# Patient Record
Sex: Female | Born: 1965 | Race: White | Hispanic: No | Marital: Married | Smoking: Never smoker
Health system: Southern US, Community
[De-identification: ages and names within clinical notes are randomized; demographics above are authoritative.]

## PROBLEM LIST (undated history)

## (undated) DIAGNOSIS — R112 Nausea with vomiting, unspecified: Secondary | ICD-10-CM

## (undated) DIAGNOSIS — E039 Hypothyroidism, unspecified: Secondary | ICD-10-CM

## (undated) DIAGNOSIS — Z9889 Other specified postprocedural states: Secondary | ICD-10-CM

## (undated) HISTORY — PX: ABDOMINAL HYSTERECTOMY: SHX81

## (undated) HISTORY — DX: Hypothyroidism, unspecified: E03.9

## (undated) HISTORY — PX: AUGMENTATION MAMMAPLASTY: SUR837

---

## 1998-10-05 ENCOUNTER — Encounter: Payer: Self-pay | Admitting: Obstetrics & Gynecology

## 1998-10-05 ENCOUNTER — Ambulatory Visit (HOSPITAL_COMMUNITY): Admission: RE | Admit: 1998-10-05 | Discharge: 1998-10-05 | Payer: Self-pay | Admitting: Obstetrics & Gynecology

## 1999-10-29 ENCOUNTER — Other Ambulatory Visit: Admission: RE | Admit: 1999-10-29 | Discharge: 1999-10-29 | Payer: Self-pay | Admitting: Obstetrics & Gynecology

## 2001-07-31 ENCOUNTER — Encounter: Payer: Self-pay | Admitting: Obstetrics & Gynecology

## 2001-07-31 ENCOUNTER — Encounter: Admission: RE | Admit: 2001-07-31 | Discharge: 2001-07-31 | Payer: Self-pay | Admitting: Obstetrics & Gynecology

## 2001-11-25 ENCOUNTER — Other Ambulatory Visit: Admission: RE | Admit: 2001-11-25 | Discharge: 2001-11-25 | Payer: Self-pay | Admitting: Obstetrics & Gynecology

## 2002-03-05 ENCOUNTER — Ambulatory Visit (HOSPITAL_COMMUNITY): Admission: RE | Admit: 2002-03-05 | Discharge: 2002-03-05 | Payer: Self-pay | Admitting: Obstetrics & Gynecology

## 2002-03-05 ENCOUNTER — Encounter (INDEPENDENT_AMBULATORY_CARE_PROVIDER_SITE_OTHER): Payer: Self-pay | Admitting: Specialist

## 2002-12-24 ENCOUNTER — Other Ambulatory Visit: Admission: RE | Admit: 2002-12-24 | Discharge: 2002-12-24 | Payer: Self-pay | Admitting: Obstetrics & Gynecology

## 2004-01-17 ENCOUNTER — Other Ambulatory Visit: Admission: RE | Admit: 2004-01-17 | Discharge: 2004-01-17 | Payer: Self-pay | Admitting: Obstetrics & Gynecology

## 2005-05-08 ENCOUNTER — Other Ambulatory Visit: Admission: RE | Admit: 2005-05-08 | Discharge: 2005-05-08 | Payer: Self-pay | Admitting: Obstetrics & Gynecology

## 2005-12-16 HISTORY — PX: ABDOMINAL HYSTERECTOMY: SHX81

## 2006-02-12 ENCOUNTER — Observation Stay (HOSPITAL_COMMUNITY): Admission: RE | Admit: 2006-02-12 | Discharge: 2006-02-13 | Payer: Self-pay | Admitting: Obstetrics & Gynecology

## 2006-02-12 ENCOUNTER — Encounter (INDEPENDENT_AMBULATORY_CARE_PROVIDER_SITE_OTHER): Payer: Self-pay | Admitting: Specialist

## 2007-06-15 ENCOUNTER — Encounter: Admission: RE | Admit: 2007-06-15 | Discharge: 2007-06-15 | Payer: Self-pay | Admitting: Obstetrics & Gynecology

## 2009-04-26 ENCOUNTER — Ambulatory Visit (HOSPITAL_COMMUNITY): Admission: RE | Admit: 2009-04-26 | Discharge: 2009-04-26 | Payer: Self-pay | Admitting: Podiatry

## 2010-06-14 ENCOUNTER — Encounter: Admission: RE | Admit: 2010-06-14 | Discharge: 2010-06-14 | Payer: Self-pay | Admitting: Obstetrics & Gynecology

## 2011-01-06 ENCOUNTER — Encounter: Payer: Self-pay | Admitting: Obstetrics & Gynecology

## 2011-05-03 NOTE — Op Note (Signed)
NAMEJOELYNN, Destiny Leon                ACCOUNT NO.:  [id number]   MEDICAL RECORD NO.:  [id number]          PATIENT TYPE:  OBV   LOCATION:  9399                          FACILITY:  [facility]   PHYSICIAN:  Ilda Mori, M.D.   DATE OF BIRTH:  [birth year]   DATE OF PROCEDURE:  02/12/2006  DATE OF DISCHARGE:                                 OPERATIVE REPORT   PREOPERATIVE DIAGNOSIS:  Menorrhagia, dysmenorrhea.   POSTOPERATIVE DIAGNOSIS:  Menorrhagia, dysmenorrhea.   PROCEDURE:  Laparoscopically-assisted vaginal hysterectomy.   SURGEON:  Dr. Ilda Mori   ASSISTANT:  Dr. Lodema Hong.   ANESTHESIA:  Was general endotracheal   ESTIMATED BLOOD LOSS:  100 mL.   FINDINGS:  Filmy bilateral adnexal adhesions. No evidence of endometriosis.  Small anterior uterine myoma.   COMPLICATIONS:  Were none.   SPECIMENS TO PATHOLOGY:  The uterus.   INDICATIONS:  This is a 45 year old nulligravida female who has a long  history of heavy painful periods. The patient was treated with cryoablation  in 2003 and had approximately 1-2 years of relief however, the dysmenorrhea  and menorrhagia recurred and the patient elects to proceed with a vaginal  hysterectomy. Because of her history of infertility and pain decision was  made to proceed with diagnostic laparoscopy prior to proceeding with vaginal  hysterectomy to ensure that there was no adnexal pathology present.   PROCEDURE:  The patient was taken to the operating placed in supine position  and general endotracheal anesthesia was induced. She was then placed in  modified dorsal lithotomy position and the abdomen, perineum and vagina were  prepped and draped in sterile fashion. The bladder was catheterized and a  Hulka tenaculum placed in the endocervical canal and affixed to the anterior  cervical lip. The surgeon the regowned and gloved. A small incision was made  in the base of the umbilicus. The Veress needle was introduced into  peritoneal cavity  and pneumoperitoneum was created. The 5 mm trocar was then  placed and a 5 mm scope was used to view the pelvis. An accessory stab was  placed in the suprapubic area and another 5 mm port was placed and the  pelvis was viewed and several adhesions were noted. The ovaries were  occluded by these adhesions and scarred down to the pelvic sidewalls. These  adhesions were filmy and decision was made to take them down. An accessory  instrument was placed through another stab wound in the left lower quadrant  and another instrument was placed. Using the laparoscopic graspers for  traction the laparoscopic scissors were used to cut down the filmy adhesions  without any bleeding. At the end of this procedure both ovaries were freed  from their adhesions and the tubes were mobile. Small clubbing was noted on  either side but no hydrosalpingies were seen. The ovarian uterine  anastomosis and the round ligaments were then cauterized with a tripolar  cautery current and cut to free up the fundus of the uterus down to and  slightly past the round ligaments. The procedure was then continued  vaginally. The patient was  placed in a normal dorsal lithotomy position and  the weighted speculum was placed in the vagina. The cervix was grasped with  a heavy cervical tenaculum and the paracervical tissues were infiltrated  with a dilute Marcaine solution with 1:200,000 epinephrine. The cervix was  then circumcised and the vaginal mucosa was dissected free. The anterior cul-  de-sac was then developed but the peritoneum was not entered. The posterior  cul-de-sac was developed and the peritoneum was entered posteriorly. The  uterosacral ligaments were grasped bilaterally with Heaney clamps, cut and  ligated with a transfixion suture and held. The remainder of the cardinal  ligaments were then grasped with a gyrus vessel sealing forceps, cauterized  and cut. At this point the anterior cul-de-sac was entered easily  and the  remainder of the cardinal ligaments and the uterine arteries were grasped,  cauterized with the sealing forceps and cut. The dissection was then  continued and cauterization was continued until the abdominal cauterization  areas were met and the uterus was then removed. The posterior cul-de-sac was  then closed with a running interlocking Vicryl one suture. A cul-de-sac  support stitch was placed in the posterior cul-de-sac from uterosacral to  uterosacral. The peritoneum was then closed with a pursestring suture. These  sutures were then tied and the uterosacral pedicle was then also tied across  the midline. The vaginal cuff was closed with figure-of-eight sutures. The  bladder was catheterized. Clear urine was obtained. The surgeon then  regloved and the pneumoperitoneum was recreated. The laparoscope was used to  visualize the surgical field and all the pedicles appeared to be dry.  Procedure was then terminated and the abdominal wounds were closed with  Dermabond adhesive. The procedure was then terminated. The patient left the  operating room in good condition.      Ilda Mori, M.D.  Electronically Signed     RK/MEDQ  D:  02/12/2006  T:  02/13/2006  Job:  [id number]

## 2011-05-03 NOTE — H&P (Signed)
NAME:  Destiny Leon, Destiny Leon                ACCOUNT NO.:  [id number]   MEDICAL RECORD NO.:  [id number]          PATIENT TYPE:  AMB   LOCATION:  SDC                           FACILITY:  [facility]   PHYSICIAN:  Ilda Mori, M.D.   DATE OF BIRTH:  [birth year]   DATE OF ADMISSION:  02/12/2006  DATE OF DISCHARGE:                                HISTORY & PHYSICAL   CHIEF COMPLAINT:  Menorrhagia and dysmenorrhea.   HISTORY OF PRESENT ILLNESS:  This is a 45 year old nulligravid female who  has been followed in the office for several years with heavy menstrual  periods and moderate to severe dysmenorrhea.  The patient understand a  cryoablation in 2003 with good results.  However, approximately a year and a  half after the procedure, the patient once again began noting increasingly  heavy and painful periods.  At that time, options including repeat  endometrial ablation, Mirena IUD or continuous birth control pills were  discussed with the patient.  The patient tried continuous birth control  pills, but felt that these really did not alleviate her symptoms.  The  decision was therefore reached to proceed with hysterectomy for control of  her abnormal bleeding and painful bleeding.   PAST MEDICAL HISTORY:  She has a history of infertility.  In addition, she  had a cryoablation in 2003.  She has no medical problems.   MEDICATIONS:  She is on no medications at the present time.   ALLERGIES:  No known drug allergies.   SOCIAL HISTORY:  She exercises regularly.  She does not smoke.  She has  wanted to adopt a child, and works as a Engineer, agricultural.   FAMILY HISTORY:  Positive for high cholesterol in her mother and brother.  Osteoporosis in her mother and grandmother.  Negative for breast, ovarian,  uterine or colon cancer, and negative for heart disease and hypertension.   REVIEW OF SYSTEMS:  Negative in detail.   PHYSICAL EXAMINATION:  VITAL SIGNS:  She is 5 feet, 4 inches tall.  She  weighs 115  pounds.  Afebrile.  Regular sinus rhythm.  Blood pressure 100/50.  HEENT:  Ears, nose and throat are normal.  NECK:  Supple without thyromegaly.  HEART:  Without murmurs or gallops.  LUNGS:  Clear.  ABDOMEN:  Soft without hepatosplenomegaly.  BREASTS:  Without masses.  She does have bilaterally implants.  PELVIC EXAM:  Normal external genitalia.  The cervix appears normal.  Uterus  is well supported, normal size, shape and contour.  Her adnexa are without  masses.   The decision was made to proceed with a vaginal hysterectomy.  Because of  her history of infertility and her significant painful periods, a  laparoscopic evaluation was performed at the time of hysterectomy to rule  out a significant endometriosis or pelvic adhesive disease.  At that point,  the adnexa will be removed only if pathology is  found.  A vaginal hysterectomy will be attempted, which, based upon the  concerns over the fact that she is nulliparous, will hopefully go smoothly,  but the patient understands that an  abdominal approach may be necessary if  the appropriate descensus cannot be obtained.      Ilda Mori, M.D.  Electronically Signed     RK/MEDQ  D:  02/11/2006  T:  02/11/2006  Job:  [id number]

## 2011-05-03 NOTE — Op Note (Signed)
[facility]  PatientSHARAE, Destiny Leon Visit Number: [id number] MRN: [id number]          Service Type: DSU Location: [facility] Attending Physician:  Lars Pinks Dictated by:   Caralyn Guile. [name], M.D. Proc. Date: 03/05/02 Admit Date:  03/05/2002 Discharge Date: 03/05/2002                             Operative Report  PREOPERATIVE DIAGNOSIS:       Menorrhagia.  POSTOPERATIVE DIAGNOSIS:      Menorrhagia.  OPERATION:                    Hysteroscopy, D&C, and cryoablation.  SURGEON:                      Richard D. [name], M.D.  ANESTHESIA:                   IV sedation with paracervical block.  ESTIMATED BLOOD LOSS:         10 cc.  FINDINGS:                     Endometrial cavity appeared normal.  There were no polyps or myoma, and sounded to 7 cm.  INDICATIONS:                  This is a 45 year old nulligravid female who has had periods lasting 10-14 days with premenstrual spotting of six to seven days.  The problem has lasted for more than year and has not responded to nonsteroidal anti-inflammatory drugs or hormonal therapy.  Options were discussed with the patient and it was elected to perform a cryoablation in the hopes of creating amenorrhea or limiting the number of flow days and making her periods lighter.  DESCRIPTION OF PROCEDURE:     The patient was taken to the operating room and placed in the dorsolithotomy position.  IV sedation was administered.  The vulva was prepped.  Evaluation under anesthesia revealed a normal size retroverted uterus.  The anterior lip of the cervix was grasped with a single tooth tenaculum.  Twenty cubic centimeters of 1% lidocaine was placed in the paracervical tissues.  The uterus was sounded to 7 cm.  The uterus was dilated with Shawnie Pons dilators to a 23-French.  A hysteroscope was introduced and the endometrial cavity was viewed.  The tissue appeared to be somewhat thickened and the decision was  made to proceed with a D&C to thin the lining in hopes of improving the ablation.  D&C was then carried out.  The hysteroscope was reintroduced and a fair amount of shaggy endometrium was noted.  A suction curettage was then performed with a #7 curet to further thin the endometrium.  Following this, a cryoprobe was introduced into the right cornual area, and the endometrium was frozen for five minutes.  The cryoprobe was then reintroduced into the left cornual area and a six minute freeze was performed. The cryoprobe was then repositioned in the left cornual area and an additional four minute freeze was performed.  The procedure was then terminated.  The patient left the operating room in good condition. Dictated by:   Caralyn Guile [name], M.D. Attending Physician:  Lars Pinks DD:  03/05/02 TD:  03/08/02 Job: [id number]

## 2016-04-12 ENCOUNTER — Encounter: Payer: Self-pay | Admitting: Primary Care

## 2016-04-12 ENCOUNTER — Ambulatory Visit (INDEPENDENT_AMBULATORY_CARE_PROVIDER_SITE_OTHER): Payer: BLUE CROSS/BLUE SHIELD | Admitting: Primary Care

## 2016-04-12 VITALS — BP 104/64 | HR 71 | Temp 98.5°F | Ht <= 58 in | Wt 121.4 lb

## 2016-04-12 DIAGNOSIS — B370 Candidal stomatitis: Secondary | ICD-10-CM | POA: Insufficient documentation

## 2016-04-12 DIAGNOSIS — E039 Hypothyroidism, unspecified: Secondary | ICD-10-CM

## 2016-04-12 LAB — TSH: TSH: 2.28 u[IU]/mL (ref 0.35–4.50)

## 2016-04-12 MED ORDER — NYSTATIN 100000 UNIT/ML MT SUSP: OROMUCOSAL | Status: DC

## 2016-04-12 NOTE — Patient Instructions (Addendum)
Complete lab work prior to leaving today. I will notify you of your results once received.   I will send refills of your levothyroxine 50 mcg through mail order.  Swish and swallow 5 ml of the Nystatin oral solution four times daily. Try to retain the fluid for at least 1-2 minutes prior to swallowing.   Please schedule a physical with me this summer/fall at your convenience. You may also schedule a lab only appointment 3-4 days prior. We will discuss your lab results in detail during your physical.  It was a pleasure to meet you today! Please don't hesitate to call me with any questions. Welcome to [facility]!

## 2016-04-12 NOTE — Assessment & Plan Note (Signed)
Present to tongue for 6 months. Good oral hygiene.  Suspect yeast buildup and will treat with Nystatin oral solution. She is to update me if no improvement.

## 2016-04-12 NOTE — Progress Notes (Signed)
   Subjective:    Patient ID: Destiny Leon, female    DOB: 01/25/1966, 50 y.o.   MRN: [id number]  HPI  Ms. Destiny Leon is a 50 year old female who presents today to establish care and discuss the problems mentioned below. Will obtain old records. Her last physical was May 2016.   1) Hypothyroidism: Diagnosed 5-6 years ago. Currently managed on Levothyroxine 50 mcg. Her last TSH was about 1 year ago and was normal. She is needing a refill of her medication.   2) White Film: Located to her tongue. She has noticed a white film on her tongue consistently for the past 6 months. Her dentist provided her with a plastic scraper that she uses with some/temporary improvement. Denies pain, itching, changes in taste. She's had no other treatment.   Review of Systems  Constitutional: Negative for unexpected weight change.  HENT: Negative for rhinorrhea.        White substance on tongue  Respiratory: Negative for cough and shortness of breath.   Cardiovascular: Negative for chest pain and palpitations.  Endocrine: Negative for cold intolerance.  Neurological: Negative for dizziness and headaches.       Past Medical History  Diagnosis Date  . Hypothyroidism      Social History   Social History  . Marital Status: Married    Spouse Name: N/A  . Number of Children: N/A  . Years of Education: N/A   Occupational History  . Not on file.   Social History Main Topics  . Smoking status: Never Smoker   . Smokeless tobacco: Not on file  . Alcohol Use: No  . Drug Use: No  . Sexual Activity: Not on file   Other Topics Concern  . Not on file   Social History Narrative   Married.   1 child   Works in [employer]   Enjoys gardening, spending time with her son.        Past Surgical History  Procedure Laterality Date  . Abdominal hysterectomy  2007    Family History  Problem Relation Age of Onset  . Heart disease Father   . Hyperlipidemia Mother   . Hypothyroidism Mother     No Known  Allergies  No current outpatient prescriptions on file prior to visit.   No current facility-administered medications on file prior to visit.    BP 104/64 mmHg  Pulse 71  Temp(Src) 98.5 F (36.9 C) (Oral)  Ht 4' 5.25" (1.353 m)  Wt 121 lb 6.4 oz (55.067 kg)  BMI 30.08 kg/m2  SpO2 98%    Objective:   Physical Exam  Constitutional: She appears well-nourished.  HENT:  Mild amount of whitish substance coating tongue.  Neck: Neck supple.  Cardiovascular: Normal rate and regular rhythm.   Pulmonary/Chest: Effort normal and breath sounds normal.  Skin: Skin is warm and dry.  Psychiatric: She has a normal mood and affect.          Assessment & Plan:

## 2016-04-12 NOTE — Assessment & Plan Note (Signed)
History of for 5-6 years, currently managed on levothyroxine 50 mcg. TSH pending today, will send refills to mail order if stable.

## 2016-04-14 ENCOUNTER — Other Ambulatory Visit: Payer: Self-pay | Admitting: Primary Care

## 2016-04-14 DIAGNOSIS — E039 Hypothyroidism, unspecified: Secondary | ICD-10-CM

## 2016-04-14 MED ORDER — LEVOTHYROXINE SODIUM 50 MCG PO TABS: 50.0000 ug | ORAL_TABLET | Freq: Every day | ORAL | Status: DC

## 2016-06-19 ENCOUNTER — Encounter: Payer: Self-pay | Admitting: Family Medicine

## 2016-06-19 ENCOUNTER — Ambulatory Visit (INDEPENDENT_AMBULATORY_CARE_PROVIDER_SITE_OTHER): Payer: BLUE CROSS/BLUE SHIELD | Admitting: Family Medicine

## 2016-06-19 VITALS — BP 114/76 | HR 72 | Temp 98.1°F | Wt 122.2 lb

## 2016-06-19 DIAGNOSIS — M7989 Other specified soft tissue disorders: Secondary | ICD-10-CM | POA: Diagnosis not present

## 2016-06-19 DIAGNOSIS — IMO0001 Reserved for inherently not codable concepts without codable children: Secondary | ICD-10-CM

## 2016-06-19 LAB — CBC WITH DIFFERENTIAL/PLATELET
BASOS PCT: 0.6 % (ref 0.0–3.0)
Basophils Absolute: 0 10*3/uL (ref 0.0–0.1)
EOS ABS: 0.3 10*3/uL (ref 0.0–0.7)
EOS PCT: 3.3 % (ref 0.0–5.0)
HEMATOCRIT: 38 % (ref 36.0–46.0)
HEMOGLOBIN: 12.8 g/dL (ref 12.0–15.0)
LYMPHS PCT: 26.9 % (ref 12.0–46.0)
Lymphs Abs: 2 10*3/uL (ref 0.7–4.0)
MCHC: 33.7 g/dL (ref 30.0–36.0)
MCV: 88.3 fl (ref 78.0–100.0)
Monocytes Absolute: 0.6 10*3/uL (ref 0.1–1.0)
Monocytes Relative: 7.7 % (ref 3.0–12.0)
Neutro Abs: 4.7 10*3/uL (ref 1.4–7.7)
Neutrophils Relative %: 61.5 % (ref 43.0–77.0)
Platelets: 263 10*3/uL (ref 150.0–400.0)
RBC: 4.31 Mil/uL (ref 3.87–5.11)
RDW: 12.9 % (ref 11.5–15.5)
WBC: 7.6 10*3/uL (ref 4.0–10.5)

## 2016-06-19 LAB — HIGH SENSITIVITY CRP: CRP, High Sensitivity: 0.73 mg/L (ref 0.000–5.000)

## 2016-06-19 LAB — RHEUMATOID FACTOR: Rhuematoid fact SerPl-aCnc: <10 IU/mL (ref <=14)

## 2016-06-19 LAB — URIC ACID: Uric Acid, Serum: 4.7 mg/dL (ref 2.4–7.0)

## 2016-06-19 LAB — SEDIMENTATION RATE: SED RATE: 13 mm/h (ref 0–30)

## 2016-06-19 MED ORDER — PREDNISONE 20 MG PO TABS: ORAL_TABLET | ORAL | Status: DC

## 2016-06-19 NOTE — Progress Notes (Signed)
Pre visit review using our clinic review tool, if applicable. No additional management support is needed unless otherwise documented below in the visit note. 

## 2016-06-19 NOTE — Patient Instructions (Addendum)
Labs today. Start prednisone course - sent to pharmacy.  If worsening instead of improving let us know for hand doctor referral.

## 2016-06-19 NOTE — Progress Notes (Signed)
BP 114/76 mmHg  Pulse 72  Temp(Src) 98.1 F (36.7 C) (Oral)  Wt 122 lb 4 oz (55.452 kg)   CC: L hand swelling  Subjective:    Patient ID: Destiny Leon, female    DOB: 1966/09/04, 50 y.o.   MRN: [id number]  HPI: Destiny Leon is a 50 y.o. female presenting on 06/19/2016 for Hand Pain   3d h/o L hand swelling. Started Monday afternoon with swelling and pain of index finger. Yesterday still able to type on computer. Today awoke with pain/swelling of entire hand with radiation of pain down elbow - ache/throbbing pain. Unable to straighten fingers due to pain. Feeling tired/run down.   No fevers/chills, nausea, new rashes. No redness/warmth. No other joint problems in the past.   Tried benadryl and tylenol today as well as essential oil.  Worked in yard Saturday without known injury. No known bug bite either. She did have mild exposure to poison oak, nothing new.   Relevant past medical, surgical, family and social history reviewed and updated as indicated. Interim medical history since our last visit reviewed. Allergies and medications reviewed and updated. Current Outpatient Prescriptions on File Prior to Visit  Medication Sig  . levothyroxine (SYNTHROID, LEVOTHROID) 50 MCG tablet Take 1 tablet (50 mcg total) by mouth daily before breakfast.   No current facility-administered medications on file prior to visit.    Review of Systems Per HPI unless specifically indicated in ROS section     Objective:    BP 114/76 mmHg  Pulse 72  Temp(Src) 98.1 F (36.7 C) (Oral)  Wt 122 lb 4 oz (55.452 kg)  Wt Readings from Last 3 Encounters:  06/19/16 122 lb 4 oz (55.452 kg)  04/12/16 121 lb 6.4 oz (55.067 kg)    Physical Exam  Constitutional: She appears well-developed and well-nourished. No distress.  Musculoskeletal: She exhibits edema.  R hand WNL L hand with evident swelling and pain to palpation at 2nd MCP and PIP, diminished ROM throughout 2nd finger. Mild discomfort and  swelling 3rd finger MCP/PIP. 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 No rash noted. No erythema.  Psychiatric: She has a normal mood and affect.  Nursing note and vitals reviewed.  Results for orders placed or performed in visit on 04/12/16  TSH  Result Value Ref Range   TSH 2.28 0.35 - 4.50 uIU/mL      Assessment & Plan:   Problem List Items Addressed This Visit    Swelling of second finger of left hand - Primary    Marked swelling with pain of L 2nd index finger at MCP, PIP, mild dactylitis that started after working in the yard. Unclear etiology.  Focal acute inflammatory arthritis of 2nd MCP/PIP vs ?tendonitis.   Check labs today including CBC, urate, ESR, CRP, RF and anti-CCP.  Treat with prednisone course. Doubt septic joint as no erythema or warmth noted today - antibiotic not started.  If worsening over next 24 hours, advised to update [place] for urgent referral to hand surgery for further evaluation. Pt agrees with plan.       Relevant Orders   CBC with Differential/Platelet   Uric acid   Sedimentation rate   High sensitivity CRP   Rheumatoid factor   Cyclic citrul peptide antibody, IgG       Follow up plan: Return if symptoms worsen  or fail to improve.  Ria Bush, MD

## 2016-06-19 NOTE — Assessment & Plan Note (Addendum)
Marked swelling with pain of L 2nd index finger at MCP, PIP, mild dactylitis that started after working in the yard. Unclear etiology.  Focal acute inflammatory arthritis of 2nd MCP/PIP vs ?tendonitis.   Check labs today including CBC, urate, ESR, CRP, RF and anti-CCP.  Treat with prednisone course. Doubt septic joint as no erythema or warmth noted today - antibiotic not started.  If worsening over next 24 hours, advised to update [place] for urgent referral to hand surgery for further evaluation. Pt agrees with plan.

## 2016-06-20 LAB — CYCLIC CITRUL PEPTIDE ANTIBODY, IGG

## 2017-03-09 ENCOUNTER — Other Ambulatory Visit: Payer: Self-pay | Admitting: Primary Care

## 2017-03-09 DIAGNOSIS — E039 Hypothyroidism, unspecified: Secondary | ICD-10-CM

## 2017-03-10 NOTE — Telephone Encounter (Signed)
Lm on pts vm requesting a call back to schedule OV 

## 2017-03-10 NOTE — Telephone Encounter (Signed)
Ok to refill? Electronically refill request for levothyroxine (SYNTHROID, LEVOTHROID) 50 MCG tablet. Last prescribed on 04/14/2016. Last seen on 06/19/2016.

## 2017-03-12 NOTE — Telephone Encounter (Signed)
Spoken to patient. CPE on 04/23/2017

## 2017-04-23 ENCOUNTER — Encounter: Payer: Self-pay | Admitting: Primary Care

## 2017-04-23 ENCOUNTER — Ambulatory Visit (INDEPENDENT_AMBULATORY_CARE_PROVIDER_SITE_OTHER): Payer: BLUE CROSS/BLUE SHIELD | Admitting: Primary Care

## 2017-04-23 VITALS — BP 118/82 | HR 62 | Temp 98.0°F | Ht 64.5 in | Wt 122.2 lb

## 2017-04-23 DIAGNOSIS — Z0000 Encounter for general adult medical examination without abnormal findings: Secondary | ICD-10-CM | POA: Insufficient documentation

## 2017-04-23 DIAGNOSIS — Z23 Encounter for immunization: Secondary | ICD-10-CM

## 2017-04-23 DIAGNOSIS — R42 Dizziness and giddiness: Secondary | ICD-10-CM | POA: Diagnosis not present

## 2017-04-23 DIAGNOSIS — Z0001 Encounter for general adult medical examination with abnormal findings: Secondary | ICD-10-CM | POA: Insufficient documentation

## 2017-04-23 DIAGNOSIS — E039 Hypothyroidism, unspecified: Secondary | ICD-10-CM

## 2017-04-23 MED ORDER — ONDANSETRON 4 MG PO TBDP: 4.0000 mg | ORAL_TABLET | Freq: Three times a day (TID) | ORAL | 0 refills | Status: DC | PRN

## 2017-04-23 NOTE — Progress Notes (Signed)
Pre visit review using our clinic review tool, if applicable. No additional management support is needed unless otherwise documented below in the visit note. 

## 2017-04-23 NOTE — Assessment & Plan Note (Signed)
Td due, provided today. Mammogram due, she declines. Colonoscopy due, she opts for Cologuard and will check with insurance for coverage. Discussed to start exercising. Exam unremarkable. Labs pending. Follow up in 1 year.

## 2017-04-23 NOTE — Assessment & Plan Note (Signed)
TSH pending, will make adjustments to levothyroxine if necessary. Continue 50 mcg for now.

## 2017-04-23 NOTE — Progress Notes (Signed)
Subjective:    Patient ID: Destiny Leon, female    DOB: 12/03/1966, 51 y.o.   MRN: [id number]  HPI  Destiny Leon is a 51 year old female who presents today for complete physical.  Immunizations: -Tetanus: Due. -Influenza: Did not complete last season.   Diet: She endorses a healthy diet. Breakfast: Protein bars Lunch: Frozen meal, left overs Dinner: Fish, chicken, pork, vegetable Snacks: None Desserts: Ice cream, three nights weekly. Beverages: Coffee, carnation instant breakfast, water, decaf sweet tea  Exercise: She does not regularly exercise, is active.  Eye exam: Completed in December 2017. Dental exam: Completes semi-annually. Colonoscopy: Declines colonoscopy, opts for Cologuard. Pap Smear: Hysterectomy Mammogram: Completed 2 years ago. Declines.   Review of Systems  Constitutional: Negative for unexpected weight change.  HENT: Negative for rhinorrhea.   Respiratory: Negative for cough and shortness of breath.   Cardiovascular: Negative for chest pain.  Gastrointestinal: Negative for constipation and diarrhea.  Genitourinary: Negative for difficulty urinating and menstrual problem.  Musculoskeletal: Positive for arthralgias. Negative for myalgias.  Skin: Negative for rash.  Allergic/Immunologic: Negative for environmental allergies.  Neurological: Negative for numbness and headaches.       Intermittent vertigo  Psychiatric/Behavioral:       She denies concerns for anxiety or depression       Past Medical History:  Diagnosis Date  . Hypothyroidism      Social History   Social History  . Marital status: Married    Spouse name: N/A  . Number of children: N/A  . Years of education: N/A   Occupational History  . Not on file.   Social History Main Topics  . Smoking status: Never Smoker  . Smokeless tobacco: Never Used  . Alcohol use No  . Drug use: No  . Sexual activity: Not on file   Other Topics Concern  . Not on file   Social History  Narrative   Married.   1 child   Works in [employer]   Enjoys gardening, spending time with her son.        Past Surgical History:  Procedure Laterality Date  . ABDOMINAL HYSTERECTOMY  2007    Family History  Problem Relation Age of Onset  . Heart disease Father   . Hyperlipidemia Mother   . Hypothyroidism Mother     No Known Allergies  Current Outpatient Prescriptions on File Prior to Visit  Medication Sig Dispense Refill  . levothyroxine (SYNTHROID, LEVOTHROID) 50 MCG tablet TAKE 1 TABLET DAILY BEFORE BREAKFAST 90 tablet 0   No current facility-administered medications on file prior to visit.     BP 118/82   Pulse 62   Temp 98 F (36.7 C) (Oral)   Ht 5' 4.5" (1.638 m)   Wt 122 lb 4 oz (55.5 kg)   BMI 20.66 kg/m    Objective:   Physical Exam  Constitutional: She is oriented to person, place, and time. She appears well-nourished.  HENT:  Right Ear: Tympanic membrane and ear canal normal.  Left Ear: Tympanic membrane and ear canal normal.  Nose: Nose normal.  Mouth/Throat: Oropharynx is clear and moist.  Eyes: Conjunctivae and EOM are normal. Pupils are equal, round, and reactive to light.  Neck: Neck supple. No thyromegaly present.  Cardiovascular: Normal rate and regular rhythm.   No murmur heard. Pulmonary/Chest: Effort normal and breath sounds normal. She has no rales.  Abdominal: Soft. Bowel sounds are normal. There is no tenderness.  Musculoskeletal: Normal range of motion.  Lymphadenopathy:    She has no cervical adenopathy.  Neurological: She is alert and oriented to person, place, and time. She has normal reflexes. No cranial nerve deficit.  Skin: Skin is warm and dry. No rash noted.  Psychiatric: She has a normal mood and affect.          Assessment & Plan:

## 2017-04-23 NOTE — Assessment & Plan Note (Signed)
Intermittent bouts, infrequent. Rx for Zofran provided to use PRN. Discussed use of Meclizine and Epley maneuvers.

## 2017-04-23 NOTE — Patient Instructions (Addendum)
Schedule a lab only appointment to return fasting for your labs. You may have water and black coffee. I'll be in touch once I receive your results.  Start exercising. You should be getting 150 minutes of moderate intensity exercise weekly.  Continue your efforts towards a healthy diet.  I sent a prescription for ondansetron (Zofran) tablets to your pharmacy. Melt one tablet under your tongue every 8 hours as needed for nausea related to vertigo.  You may try Meclizine tablets as needed for vertigo. Caution as this may cause drowsiness.  You were provided with a tetanus vaccination which will cover you for 10 years.  Follow up in 1 year for your annual exam or sooner if needed.  It was a pleasure to see you today!  How to Perform the Epley Maneuver The Epley maneuver is an exercise that relieves symptoms of vertigo. Vertigo is the feeling that you or your surroundings are moving when they are not. When you feel vertigo, you may feel like the room is spinning and have trouble walking. Dizziness is a little different than vertigo. When you are dizzy, you may feel unsteady or light-headed. You can do this maneuver at home whenever you have symptoms of vertigo. You can do it up to 3 times a day until your symptoms go away. Even though the Epley maneuver may relieve your vertigo for a few weeks, it is possible that your symptoms will return. This maneuver relieves vertigo, but it does not relieve dizziness. What are the risks? If it is done correctly, the Epley maneuver is considered safe. Sometimes it can lead to dizziness or nausea that goes away after a short time. If you develop other symptoms, such as changes in vision, weakness, or numbness, stop doing the maneuver and call your health care provider. How to perform the Epley maneuver 1. Sit on the edge of a bed or table with your back straight and your legs extended or hanging over the edge of the bed or table. 2. Turn your head halfway  toward the affected ear or side. 3. Lie backward quickly with your head turned until you are lying flat on your back. You may want to position a pillow under your shoulders. 4. Hold this position for 30 seconds. You may experience an attack of vertigo. This is normal. 5. Turn your head to the opposite direction until your unaffected ear is facing the floor. 6. Hold this position for 30 seconds. You may experience an attack of vertigo. This is normal. Hold this position until the vertigo stops. 7. Turn your whole body to the same side as your head. Hold for another 30 seconds. 8. Sit back up. You can repeat this exercise up to 3 times a day. Follow these instructions at home:  After doing the Epley maneuver, you can return to your normal activities.  Ask your health care provider if there is anything you should do at home to prevent vertigo. He or she may recommend that you:  Keep your head raised (elevated) with two or more pillows while you sleep.  Do not sleep on the side of your affected ear.  Get up slowly from bed.  Avoid sudden movements during the day.  Avoid extreme head movement, like looking up or bending over. Contact a health care provider if:  Your vertigo gets worse.  You have other symptoms, including:  Nausea.  Vomiting.  Headache. Get help right away if:  You have vision changes.  You have a severe  or worsening headache or neck pain.  You cannot stop vomiting.  You have new numbness or weakness in any part of your body. Summary  Vertigo is the feeling that you or your surroundings are moving when they are not.  The Epley maneuver is an exercise that relieves symptoms of vertigo.  If the Epley maneuver is done correctly, it is considered safe. You can do it up to 3 times a day. This information is not intended to replace advice given to you by your health care provider. Make sure you discuss any questions you have with your health care  provider. Document Released: 12/07/2013 Document Revised: 10/22/2016 Document Reviewed: 10/22/2016 Elsevier Interactive Patient Education  2017 [employer].

## 2017-04-24 ENCOUNTER — Encounter: Payer: Self-pay | Admitting: *Deleted

## 2017-04-24 ENCOUNTER — Other Ambulatory Visit (INDEPENDENT_AMBULATORY_CARE_PROVIDER_SITE_OTHER): Payer: BLUE CROSS/BLUE SHIELD

## 2017-04-24 DIAGNOSIS — E039 Hypothyroidism, unspecified: Secondary | ICD-10-CM | POA: Diagnosis not present

## 2017-04-24 DIAGNOSIS — Z0000 Encounter for general adult medical examination without abnormal findings: Secondary | ICD-10-CM | POA: Diagnosis not present

## 2017-04-24 LAB — COMPREHENSIVE METABOLIC PANEL
ALBUMIN: 4.2 g/dL (ref 3.5–5.2)
ALT: 9 U/L (ref 0–35)
AST: 15 U/L (ref 0–37)
Alkaline Phosphatase: 62 U/L (ref 39–117)
BILIRUBIN TOTAL: 0.6 mg/dL (ref 0.2–1.2)
BUN: 17 mg/dL (ref 6–23)
CALCIUM: 9.2 mg/dL (ref 8.4–10.5)
CO2: 29 mEq/L (ref 19–32)
CREATININE: 1.01 mg/dL (ref 0.40–1.20)
Chloride: 107 mEq/L (ref 96–112)
GFR: 61.21 mL/min (ref >60.00)
Glucose, Bld: 92 mg/dL (ref 70–99)
Potassium: 4.8 mEq/L (ref 3.5–5.1)
SODIUM: 141 meq/L (ref 135–145)
Total Protein: 6.6 g/dL (ref 6.0–8.3)

## 2017-04-24 LAB — LIPID PANEL
Cholesterol: 177 mg/dL (ref 0–200)
HDL: 59.3 mg/dL (ref >39.00)
LDL Cholesterol: 103 mg/dL — ABNORMAL HIGH (ref 0–99)
NonHDL: 118.03
Total CHOL/HDL Ratio: 3
Triglycerides: 74 mg/dL (ref 0.0–149.0)
VLDL: 14.8 mg/dL (ref 0.0–40.0)

## 2017-04-24 LAB — TSH: TSH: 4.06 u[IU]/mL (ref 0.35–4.50)

## 2017-06-07 ENCOUNTER — Other Ambulatory Visit: Payer: Self-pay | Admitting: Primary Care

## 2017-06-07 DIAGNOSIS — E039 Hypothyroidism, unspecified: Secondary | ICD-10-CM

## 2017-12-05 ENCOUNTER — Other Ambulatory Visit: Payer: Self-pay | Admitting: Primary Care

## 2017-12-05 DIAGNOSIS — E039 Hypothyroidism, unspecified: Secondary | ICD-10-CM

## 2018-03-05 ENCOUNTER — Other Ambulatory Visit: Payer: Self-pay | Admitting: Primary Care

## 2018-03-05 DIAGNOSIS — E039 Hypothyroidism, unspecified: Secondary | ICD-10-CM

## 2018-04-14 ENCOUNTER — Ambulatory Visit: Payer: BLUE CROSS/BLUE SHIELD | Admitting: Family Medicine

## 2018-04-15 ENCOUNTER — Ambulatory Visit: Payer: BLUE CROSS/BLUE SHIELD | Admitting: Family Medicine

## 2018-04-15 ENCOUNTER — Encounter: Payer: Self-pay | Admitting: Family Medicine

## 2018-04-15 ENCOUNTER — Other Ambulatory Visit: Payer: Self-pay

## 2018-04-15 VITALS — BP 102/70 | HR 64 | Temp 98.4°F | Ht 64.5 in | Wt 125.2 lb

## 2018-04-15 DIAGNOSIS — R0781 Pleurodynia: Secondary | ICD-10-CM | POA: Diagnosis not present

## 2018-04-15 DIAGNOSIS — S29011A Strain of muscle and tendon of front wall of thorax, initial encounter: Secondary | ICD-10-CM | POA: Diagnosis not present

## 2018-04-15 MED ORDER — GUAIFENESIN-CODEINE 100-10 MG/5ML PO SOLN: 5.0000 mL | Freq: Every evening | ORAL | 0 refills | Status: DC | PRN

## 2018-04-15 NOTE — Progress Notes (Signed)
Dr. Karleen Hampshire T. Elena Davia, MD, CAQ [facility] [address] [place], [address] Phone: [phone] Fax: [phone]  04/15/2018  Patient: Destiny Leon, MRN: [id number], DOB: [birth year], 52 y.o.  Primary Physician:  Doreene Nest, NP   Chief Complaint  Patient presents with  . Cough  . LUQ Pain   Subjective:   Destiny Leon is a 52 y.o. very pleasant female patient who presents with the following:  Coughing really bad, will cough a lot. Had a cold, cough was extreme all day and every day. Noticed her side was going. With coughing it will get worse and not better.  This is all been present on the left lower ribs.  At times she felt like this was getting better, but after she had some worsened coughing, it did return and come back again.  Tried some alleve, motrin, tried antacids, and last night did took some tylenol.  A little bit better.     Past Medical History, Surgical History, Social History, Family History, Problem List, Medications, and Allergies have been reviewed and updated if relevant.  Patient Active Problem List   Diagnosis Date Noted  . Preventative health care 04/23/2017  . Vertigo 04/23/2017  . Hypothyroidism 04/12/2016    Past Medical History:  Diagnosis Date  . Hypothyroidism     Past Surgical History:  Procedure Laterality Date  . ABDOMINAL HYSTERECTOMY  2007    Social History   Socioeconomic History  . Marital status: Married    Spouse name: Not on file  . Number of children: Not on file  . Years of education: Not on file  . Highest education level: Not on file  Occupational History  . Not on file  Social Needs  . Financial resource strain: Not on file  . Food insecurity:    Worry: Not on file    Inability: Not on file  . Transportation needs:    Medical: Not on file    Non-medical: Not on file  Tobacco Use  . Smoking status: Never Smoker  . Smokeless tobacco: Never Used  Substance  and Sexual Activity  . Alcohol use: No    Alcohol/week: 0.0 oz  . Drug use: No  . Sexual activity: Not on file  Lifestyle  . Physical activity:    Days per week: Not on file    Minutes per session: Not on file  . Stress: Not on file  Relationships  . Social connections:    Talks on phone: Not on file    Gets together: Not on file    Attends religious service: Not on file    Active member of club or organization: Not on file    Attends meetings of clubs or organizations: Not on file    Relationship status: Not on file  . Intimate partner violence:    Fear of current or ex partner: Not on file    Emotionally abused: Not on file    Physically abused: Not on file    Forced sexual activity: Not on file  Other Topics Concern  . Not on file  Social History Narrative   Married.   1 child   Works in [employer]   Enjoys gardening, spending time with her son.     Family History  Problem Relation Age of Onset  . Heart disease Father   . Hyperlipidemia Mother   . Hypothyroidism Mother     No Known Allergies  Medication  list reviewed and updated in full in [facility].   GEN: No acute illnesses, no fevers, chills. GI: No n/v/d, eating normally Pulm: No SOB Interactive and getting along well at home.  Otherwise, ROS is as per the HPI.  Objective:   BP 102/70   Pulse 64   Temp 98.4 F (36.9 C) (Oral)   Ht 5' 4.5" (1.638 m)   Wt 125 lb 4 oz (56.8 kg)   BMI 21.17 kg/m   GEN: WDWN, NAD, Non-toxic, A & O x 3 HEENT: Atraumatic, Normocephalic. Neck supple. No masses, No LAD. Ears and Nose: No external deformity. CV: RRR, No M/G/R. No JVD. No thrill. No extra heart sounds. PULM: CTA B, no wheezes, crackles, rhonchi. No retractions. No resp. distress. No accessory muscle use.  Chest wall, neg barrel sign. Bottom sternocostal joint notably tender and intercostal space, not in the abs  EXTR: No c/c/e NEURO Normal gait.  PSYCH: Normally interactive. Conversant. Not  depressed or anxious appearing.  Calm demeanor.   Laboratory and Imaging Data:  Assessment and Plan:   Intercostal muscle strain, initial encounter  Rib pain  I suspect that she likely tore her lowest intercostal on the left, and additionally she injured her left sternocostal joint, the lowest on the left.  This will improve with time, think that the biggest thing that is going against her is her cough, and I suggested that she take some Delsym during the day and additionally take some stronger cough medication at nighttime.  NSAIDs and additional likely will help.  Follow-up: No follow-ups on file.  Meds ordered this encounter  Medications  . guaiFENesin-codeine 100-10 MG/5ML syrup    Sig: Take 5 mLs by mouth at bedtime as needed for cough.    Dispense:  120 mL    Refill:  0   There are no discontinued medications. No orders of the defined types were placed in this encounter.   Signed,  Elpidio Galea. Gerry Blanchfield, MD   Allergies as of 04/15/2018   No Known Allergies     Medication List        Accurate as of 04/15/18 11:59 PM. Always use your most recent med list.          guaiFENesin-codeine 100-10 MG/5ML syrup Take 5 mLs by mouth at bedtime as needed for cough.   levothyroxine 50 MCG tablet Commonly known as:  SYNTHROID, LEVOTHROID TAKE 1 TABLET DAILY BEFORE BREAKFAST   ondansetron 4 MG disintegrating tablet Commonly known as:  ZOFRAN ODT Take 1 tablet (4 mg total) by mouth every 8 (eight) hours as needed for nausea or vomiting.

## 2018-04-27 ENCOUNTER — Ambulatory Visit (INDEPENDENT_AMBULATORY_CARE_PROVIDER_SITE_OTHER): Payer: BLUE CROSS/BLUE SHIELD | Admitting: Primary Care

## 2018-04-27 ENCOUNTER — Encounter: Payer: Self-pay | Admitting: Primary Care

## 2018-04-27 ENCOUNTER — Ambulatory Visit (INDEPENDENT_AMBULATORY_CARE_PROVIDER_SITE_OTHER)
Admission: RE | Admit: 2018-04-27 | Discharge: 2018-04-27 | Disposition: A | Discharge status: Home / Self Care | Payer: BLUE CROSS/BLUE SHIELD | Source: Ambulatory Visit | Attending: Primary Care | Admitting: Primary Care

## 2018-04-27 VITALS — BP 110/70 | HR 58 | Temp 98.0°F | Ht 64.5 in | Wt 124.5 lb

## 2018-04-27 DIAGNOSIS — Z0000 Encounter for general adult medical examination without abnormal findings: Secondary | ICD-10-CM

## 2018-04-27 DIAGNOSIS — R053 Chronic cough: Secondary | ICD-10-CM

## 2018-04-27 DIAGNOSIS — R05 Cough: Secondary | ICD-10-CM | POA: Diagnosis not present

## 2018-04-27 DIAGNOSIS — E039 Hypothyroidism, unspecified: Secondary | ICD-10-CM | POA: Diagnosis not present

## 2018-04-27 DIAGNOSIS — R0781 Pleurodynia: Secondary | ICD-10-CM

## 2018-04-27 DIAGNOSIS — Z1231 Encounter for screening mammogram for malignant neoplasm of breast: Secondary | ICD-10-CM

## 2018-04-27 DIAGNOSIS — Z1239 Encounter for other screening for malignant neoplasm of breast: Secondary | ICD-10-CM

## 2018-04-27 HISTORY — DX: Chronic cough: R05.3

## 2018-04-27 LAB — COMPREHENSIVE METABOLIC PANEL
ALBUMIN: 4 g/dL (ref 3.5–5.2)
ALT: 10 U/L (ref 0–35)
AST: 16 U/L (ref 0–37)
Alkaline Phosphatase: 53 U/L (ref 39–117)
BUN: 18 mg/dL (ref 6–23)
CHLORIDE: 106 meq/L (ref 96–112)
CO2: 28 meq/L (ref 19–32)
CREATININE: 0.88 mg/dL (ref 0.40–1.20)
Calcium: 9.2 mg/dL (ref 8.4–10.5)
GFR: 71.48 mL/min (ref >60.00)
Glucose, Bld: 86 mg/dL (ref 70–99)
POTASSIUM: 4.3 meq/L (ref 3.5–5.1)
Sodium: 140 mEq/L (ref 135–145)
Total Bilirubin: 0.6 mg/dL (ref 0.2–1.2)
Total Protein: 6.7 g/dL (ref 6.0–8.3)

## 2018-04-27 LAB — LIPID PANEL
CHOL/HDL RATIO: 3
CHOLESTEROL: 174 mg/dL (ref 0–200)
HDL: 55.2 mg/dL (ref >39.00)
LDL CALC: 100 mg/dL — AB (ref 0–99)
NonHDL: 118.45
Triglycerides: 93 mg/dL (ref 0.0–149.0)
VLDL: 18.6 mg/dL (ref 0.0–40.0)

## 2018-04-27 LAB — TSH: TSH: 3.24 u[IU]/mL (ref 0.35–4.50)

## 2018-04-27 MED ORDER — NAPROXEN 500 MG PO TABS: 500.0000 mg | ORAL_TABLET | Freq: Two times a day (BID) | ORAL | 0 refills | Status: DC | PRN

## 2018-04-27 NOTE — Assessment & Plan Note (Signed)
Taking levothyroxine every morning with water and multi-vitamins. Discussed to take separately, avoiding any other medications and food for 30 minutes. Repeat TSH pending.

## 2018-04-27 NOTE — Patient Instructions (Addendum)
Stop by the lab and xray prior to leaving today. I will notify you of your results once received.   Return the Cologuard Kit once received.   Call the [facility] to scheduled your mammogram.  Start taking an antihistamine nightly for at least 2-3 weeks. If no improvement in your cough then switch to either Zantac or Pepcid. Please update me if your cough persists.  Try taking Naproxen 500 mg tablets twice daily for 7 days for rib pain and inflammation.   Follow up in 1 year for your annual exam or sooner if needed.  It was a pleasure to see you today!   Preventive Care 40-64 Years, Female Preventive care refers to lifestyle choices and visits with your health care provider that can promote health and wellness. What does preventive care include?  A yearly physical exam. This is also called an annual well check.  Dental exams once or twice a year.  Routine eye exams. Ask your health care provider how often you should have your eyes checked.  Personal lifestyle choices, including: ? Daily care of your teeth and gums. ? Regular physical activity. ? Eating a healthy diet. ? Avoiding tobacco and drug use. ? Limiting alcohol use. ? Practicing safe sex. ? Taking low-dose aspirin daily starting at age 27. ? Taking vitamin and mineral supplements as recommended by your health care provider. What happens during an annual well check? The services and screenings done by your health care provider during your annual well check will depend on your age, overall health, lifestyle risk factors, and family history of disease. Counseling Your health care provider may ask you questions about your:  Alcohol use.  Tobacco use.  Drug use.  Emotional well-being.  Home and relationship well-being.  Sexual activity.  Eating habits.  Work and work Statistician.  Method of birth control.  Menstrual cycle.  Pregnancy history.  Screening You may have the following tests or  measurements:  Height, weight, and BMI.  Blood pressure.  Lipid and cholesterol levels. These may be checked every 5 years, or more frequently if you are over 68 years old.  Skin check.  Lung cancer screening. You may have this screening every year starting at age 64 if you have a 30-pack-year history of smoking and currently smoke or have quit within the past 15 years.  Fecal occult blood test (FOBT) of the stool. You may have this test every year starting at age 23.  Flexible sigmoidoscopy or colonoscopy. You may have a sigmoidoscopy every 5 years or a colonoscopy every 10 years starting at age 22.  Hepatitis C blood test.  Hepatitis B blood test.  Sexually transmitted disease (STD) testing.  Diabetes screening. This is done by checking your blood sugar (glucose) after you have not eaten for a while (fasting). You may have this done every 1-3 years.  Mammogram. This may be done every 1-2 years. Talk to your health care provider about when you should start having regular mammograms. This may depend on whether you have a family history of breast cancer.  BRCA-related cancer screening. This may be done if you have a family history of breast, ovarian, tubal, or peritoneal cancers.  Pelvic exam and Pap test. This may be done every 3 years starting at age 5. Starting at age 70, this may be done every 5 years if you have a Pap test in combination with an HPV test.  Bone density scan. This is done to screen for osteoporosis. You may  have this scan if you are at high risk for osteoporosis.  Discuss your test results, treatment options, and if necessary, the need for more tests with your health care provider. Vaccines Your health care provider may recommend certain vaccines, such as:  Influenza vaccine. This is recommended every year.  Tetanus, diphtheria, and acellular pertussis (Tdap, Td) vaccine. You may need a Td booster every 10 years.  Varicella vaccine. You may need this if  you have not been vaccinated.  Zoster vaccine. You may need this after age 78.  Measles, mumps, and rubella (MMR) vaccine. You may need at least one dose of MMR if you were born in 1957 or later. You may also need a second dose.  Pneumococcal 13-valent conjugate (PCV13) vaccine. You may need this if you have certain conditions and were not previously vaccinated.  Pneumococcal polysaccharide (PPSV23) vaccine. You may need one or two doses if you smoke cigarettes or if you have certain conditions.  Meningococcal vaccine. You may need this if you have certain conditions.  Hepatitis A vaccine. You may need this if you have certain conditions or if you travel or work in places where you may be exposed to hepatitis A.  Hepatitis B vaccine. You may need this if you have certain conditions or if you travel or work in places where you may be exposed to hepatitis B.  Haemophilus influenzae type b (Hib) vaccine. You may need this if you have certain conditions.  Talk to your health care provider about which screenings and vaccines you need and how often you need them. This information is not intended to replace advice given to you by your health care provider. Make sure you discuss any questions you have with your health care provider. Document Released: 12/29/2015 Document Revised: 08/21/2016 Document Reviewed: 10/03/2015 Elsevier Interactive Patient Education  [employer].

## 2018-04-27 NOTE — Assessment & Plan Note (Signed)
Intermittent for years, worse over last several months. Differentials include asthma, allergies, silent reflux.   Start with antihistamine treatment HS for at least 2-3 weeks. Discussed to move onto H2 Blocker (Zantac/Pepcid) if no improvement.   Respiratory exam unremarkable.

## 2018-04-27 NOTE — Assessment & Plan Note (Signed)
Td UTD. Mammogram due, orders placed. Colon cancer screening due, she is ready for Cologuard. Commended her on healthy lifestyle, continue regular exercise and healthy diet. Exam overall unremarkable except as mentioned. Labs pending.  Follow up in 1 year.

## 2018-04-27 NOTE — Progress Notes (Signed)
Subjective:    Patient ID: Destiny Leon, female    DOB: July 26, 1966, 52 y.o.   MRN: [id number]  HPI  Destiny Leon is a 52 year old female who presents today for complete physical.  She continues to experience a cough, will occur with prolonged talking. She describes the cough as a "tickle" to her throat. She was last evaluated 2 weeks ago for a one month history of cough and left lower anterior and lateral rib pain. Pain is present with taking in a deep breath, laying onto her left side. She's been taking Tylenol with some improvement. She initially took Aleve and Motrin without improvement. She denies esophageal burning, belching, epigastric pain.  Immunizations: -Tetanus: Completed in 2018 -Influenza: Did not complete last season   Diet: She endorses a healthy diet Breakfast: Protein shake, protein bar, fruit  Lunch: Left overs, subway Dinner: Meat, vegetables, starch Snacks: Occasionally  Desserts: Ice cream, 3 times weekly  Beverages: Water, sweet tea  Exercise: She jogs twice daily (20 min), Yoga Eye exam: Completed in December 2018 Dental exam: Completes semi-annually  Colonoscopy: Opts for cologuard Pap Smear: Hysterectomy  Mammogram: Due   Review of Systems  Constitutional: Negative for unexpected weight change.  HENT: Negative for rhinorrhea.   Respiratory: Positive for cough. Negative for shortness of breath.   Cardiovascular: Negative for chest pain.  Gastrointestinal: Negative for constipation and diarrhea.  Genitourinary: Negative for difficulty urinating and menstrual problem.  Musculoskeletal: Positive for myalgias. Negative for arthralgias.       Left anterior/lateral rib pain  Skin: Negative for rash.  Allergic/Immunologic: Negative for environmental allergies.  Neurological: Negative for dizziness, numbness and headaches.  Psychiatric/Behavioral: The patient is not nervous/anxious.        Past Medical History:  Diagnosis Date  . Hypothyroidism        Social History   Socioeconomic History  . Marital status: Married    Spouse name: Not on file  . Number of children: Not on file  . Years of education: Not on file  . Highest education level: Not on file  Occupational History  . Not on file  Social Needs  . Financial resource strain: Not on file  . Food insecurity:    Worry: Not on file    Inability: Not on file  . Transportation needs:    Medical: Not on file    Non-medical: Not on file  Tobacco Use  . Smoking status: Never Smoker  . Smokeless tobacco: Never Used  Substance and Sexual Activity  . Alcohol use: No    Alcohol/week: 0.0 oz  . Drug use: No  . Sexual activity: Not on file  Lifestyle  . Physical activity:    Days per week: Not on file    Minutes per session: Not on file  . Stress: Not on file  Relationships  . Social connections:    Talks on phone: Not on file    Gets together: Not on file    Attends religious service: Not on file    Active member of club or organization: Not on file    Attends meetings of clubs or organizations: Not on file    Relationship status: Not on file  . Intimate partner violence:    Fear of current or ex partner: Not on file    Emotionally abused: Not on file    Physically abused: Not on file    Forced sexual activity: Not on file  Other Topics Concern  . Not  on file  Social History Narrative   Married.   1 child   Works in [employer]   Enjoys gardening, spending time with her son.     Past Surgical History:  Procedure Laterality Date  . ABDOMINAL HYSTERECTOMY  2007    Family History  Problem Relation Age of Onset  . Heart disease Father   . Hyperlipidemia Mother   . Hypothyroidism Mother     No Known Allergies  Current Outpatient Medications on File Prior to Visit  Medication Sig Dispense Refill  . levothyroxine (SYNTHROID, LEVOTHROID) 50 MCG tablet TAKE 1 TABLET DAILY BEFORE BREAKFAST 90 tablet 0   No current facility-administered medications on file  prior to visit.     BP 110/70   Pulse (!) 58   Temp 98 F (36.7 C) (Oral)   Ht 5' 4.5" (1.638 m)   Wt 124 lb 8 oz (56.5 kg)   SpO2 98%   BMI 21.04 kg/m    Objective:   Physical Exam  Constitutional: She is oriented to person, place, and time. She appears well-nourished.  HENT:  Right Ear: Tympanic membrane and ear canal normal.  Left Ear: Tympanic membrane and ear canal normal.  Nose: Nose normal.  Mouth/Throat: Oropharynx is clear and moist.  Eyes: Pupils are equal, round, and reactive to light. Conjunctivae and EOM are normal.  Neck: Neck supple. No thyromegaly present.  Cardiovascular: Normal rate and regular rhythm.  No murmur heard. Pulmonary/Chest: Effort normal and breath sounds normal. She has no rales.  Abdominal: Soft. Bowel sounds are normal. There is no tenderness.  Musculoskeletal: Normal range of motion.       Arms: Left anterior/lateral rib pain around T5-T6. Non tender upon palpation. No bruising.  Lymphadenopathy:    She has no cervical adenopathy.  Neurological: She is alert and oriented to person, place, and time. She has normal reflexes. No cranial nerve deficit.  Skin: Skin is warm and dry. No rash noted.  Psychiatric: She has a normal mood and affect.          Assessment & Plan:  Rib Pain:  Present for 2+ months since viral cold. Exam today without tenderness or deformity, although she does appear quite uncomfortable with positional changes (sitting to laying, laying to sitting). Check plain films of ribs today. Rx for naproxen course sent to pharmacy.   Destiny Nest, NP

## 2018-04-28 ENCOUNTER — Encounter: Payer: Self-pay | Admitting: *Deleted

## 2018-05-24 DIAGNOSIS — Z1211 Encounter for screening for malignant neoplasm of colon: Secondary | ICD-10-CM | POA: Diagnosis not present

## 2018-05-24 DIAGNOSIS — Z1212 Encounter for screening for malignant neoplasm of rectum: Secondary | ICD-10-CM | POA: Diagnosis not present

## 2018-06-03 ENCOUNTER — Other Ambulatory Visit: Payer: Self-pay | Admitting: Primary Care

## 2018-06-03 DIAGNOSIS — E039 Hypothyroidism, unspecified: Secondary | ICD-10-CM

## 2018-06-04 LAB — COLOGUARD: Cologuard: NEGATIVE

## 2018-06-23 ENCOUNTER — Telehealth: Payer: Self-pay

## 2018-06-23 NOTE — Telephone Encounter (Signed)
Spoken to patient and notified her that the results are in [name]'s inbox for review. I will let patient know when Destiny Leon looks at the results.

## 2018-06-23 NOTE — Telephone Encounter (Signed)
Calling Johny DrillingChan back in regard to her Cologuard test.Her cell number is the best way to reach her.

## 2018-06-25 ENCOUNTER — Telehealth: Payer: Self-pay | Admitting: Primary Care

## 2018-06-25 NOTE — Telephone Encounter (Signed)
Will you please notify patient that her Cologuard test for colon cancer screening was negative? We will repeat this in 3 years. 

## 2018-06-26 NOTE — Telephone Encounter (Signed)
Spoken and notified patient of [name] Clark's comments. Patient verbalized understanding.  

## 2018-11-30 ENCOUNTER — Other Ambulatory Visit: Payer: Self-pay | Admitting: Primary Care

## 2018-11-30 DIAGNOSIS — E039 Hypothyroidism, unspecified: Secondary | ICD-10-CM

## 2019-05-29 ENCOUNTER — Other Ambulatory Visit: Payer: Self-pay | Admitting: Primary Care

## 2019-05-29 DIAGNOSIS — E039 Hypothyroidism, unspecified: Secondary | ICD-10-CM

## 2019-07-16 ENCOUNTER — Other Ambulatory Visit: Payer: Self-pay | Admitting: Primary Care

## 2019-07-16 DIAGNOSIS — Z0000 Encounter for general adult medical examination without abnormal findings: Secondary | ICD-10-CM

## 2019-07-16 DIAGNOSIS — E039 Hypothyroidism, unspecified: Secondary | ICD-10-CM

## 2019-07-21 ENCOUNTER — Other Ambulatory Visit: Payer: Self-pay

## 2019-07-21 ENCOUNTER — Other Ambulatory Visit (INDEPENDENT_AMBULATORY_CARE_PROVIDER_SITE_OTHER): Payer: BC Managed Care – PPO

## 2019-07-21 DIAGNOSIS — E039 Hypothyroidism, unspecified: Secondary | ICD-10-CM

## 2019-07-21 DIAGNOSIS — Z0000 Encounter for general adult medical examination without abnormal findings: Secondary | ICD-10-CM | POA: Diagnosis not present

## 2019-07-21 LAB — COMPREHENSIVE METABOLIC PANEL
ALT: 10 U/L (ref 0–35)
AST: 15 U/L (ref 0–37)
Albumin: 4 g/dL (ref 3.5–5.2)
Alkaline Phosphatase: 54 U/L (ref 39–117)
BUN: 19 mg/dL (ref 6–23)
CO2: 28 mEq/L (ref 19–32)
Calcium: 9.1 mg/dL (ref 8.4–10.5)
Chloride: 106 mEq/L (ref 96–112)
Creatinine, Ser: 0.91 mg/dL (ref 0.40–1.20)
GFR: 64.4 mL/min (ref >60.00)
Glucose, Bld: 91 mg/dL (ref 70–99)
Potassium: 4.2 mEq/L (ref 3.5–5.1)
Sodium: 140 mEq/L (ref 135–145)
Total Bilirubin: 0.6 mg/dL (ref 0.2–1.2)
Total Protein: 6.5 g/dL (ref 6.0–8.3)

## 2019-07-21 LAB — LIPID PANEL
Cholesterol: 195 mg/dL (ref 0–200)
HDL: 56.7 mg/dL (ref >39.00)
LDL Cholesterol: 120 mg/dL — ABNORMAL HIGH (ref 0–99)
NonHDL: 138.69
Total CHOL/HDL Ratio: 3
Triglycerides: 93 mg/dL (ref 0.0–149.0)
VLDL: 18.6 mg/dL (ref 0.0–40.0)

## 2019-07-21 LAB — TSH: TSH: 7.27 u[IU]/mL — ABNORMAL HIGH (ref 0.35–4.50)

## 2019-07-28 ENCOUNTER — Encounter: Payer: Self-pay | Admitting: Primary Care

## 2019-07-28 ENCOUNTER — Other Ambulatory Visit: Payer: Self-pay

## 2019-07-28 ENCOUNTER — Ambulatory Visit (INDEPENDENT_AMBULATORY_CARE_PROVIDER_SITE_OTHER): Payer: BC Managed Care – PPO | Admitting: Primary Care

## 2019-07-28 VITALS — BP 100/64 | HR 86 | Temp 98.2°F | Ht 64.5 in | Wt 123.5 lb

## 2019-07-28 DIAGNOSIS — E039 Hypothyroidism, unspecified: Secondary | ICD-10-CM

## 2019-07-28 DIAGNOSIS — Z0000 Encounter for general adult medical examination without abnormal findings: Secondary | ICD-10-CM | POA: Diagnosis not present

## 2019-07-28 DIAGNOSIS — R053 Chronic cough: Secondary | ICD-10-CM

## 2019-07-28 DIAGNOSIS — Z1239 Encounter for other screening for malignant neoplasm of breast: Secondary | ICD-10-CM | POA: Diagnosis not present

## 2019-07-28 DIAGNOSIS — R05 Cough: Secondary | ICD-10-CM

## 2019-07-28 NOTE — Assessment & Plan Note (Signed)
TSH today of 7 which is odd given that she's been on her current dose of levothyroxine for years. Also asymptomatic.   Take vitamin B12 within 60 minutes of levothyroxine, otherwise taking levothyroxine appropriately. Discussed proper instructions for levothyroxine, she will make the adjustment.  Repeat TSH in 4 weeks.

## 2019-07-28 NOTE — Progress Notes (Signed)
Subjective:    Patient ID: Destiny Leon, female    DOB: 11-05-1966, 53 y.o.   MRN: [id number]  HPI  Destiny Leon is a 53 year old female who presents today for complete physical.  Immunizations: -Tetanus: Completed in 2018 -Influenza: Due this season   Diet: She endorses a healthy diet.  Breakfast: Protein shake, protein bar Lunch: Frozen dinner, sandwich, cookies Dinner: Meat, vegetables, starch Snacks: Infrequently  Desserts: 3-4 days weekly Beverages: Protein shake, flavored water, sweet tea, coffee  Exercise: She is running several days weekly, active in her yard.  Eye exam: Completed in 2019 Dental exam: Completes semi-annually  Colonoscopy: Completed in 2019, negative Pap Smear: Hysterectomy  Mammogram: Due  BP Readings from Last 3 Encounters:  07/28/19 100/64  04/27/18 110/70  04/15/18 102/70     Review of Systems  Constitutional: Negative for unexpected weight change.  HENT: Negative for rhinorrhea.   Respiratory: Negative for cough and shortness of breath.   Cardiovascular: Negative for chest pain.  Gastrointestinal: Negative for constipation and diarrhea.  Genitourinary: Negative for difficulty urinating.  Musculoskeletal: Negative for arthralgias and myalgias.  Skin: Negative for rash.  Allergic/Immunologic: Negative for environmental allergies.  Neurological: Negative for dizziness, numbness and headaches.  Psychiatric/Behavioral: The patient is not nervous/anxious.        Past Medical History:  Diagnosis Date  . Hypothyroidism      Social History   Socioeconomic History  . Marital status: Married    Spouse name: Not on file  . Number of children: Not on file  . Years of education: Not on file  . Highest education level: Not on file  Occupational History  . Not on file  Social Needs  . Financial resource strain: Not on file  . Food insecurity    Worry: Not on file    Inability: Not on file  . Transportation needs    Medical: Not on  file    Non-medical: Not on file  Tobacco Use  . Smoking status: Never Smoker  . Smokeless tobacco: Never Used  Substance and Sexual Activity  . Alcohol use: No    Alcohol/week: 0.0 standard drinks  . Drug use: No  . Sexual activity: Not on file  Lifestyle  . Physical activity    Days per week: Not on file    Minutes per session: Not on file  . Stress: Not on file  Relationships  . Social Herbalist on phone: Not on file    Gets together: Not on file    Attends religious service: Not on file    Active member of club or organization: Not on file    Attends meetings of clubs or organizations: Not on file    Relationship status: Not on file  . Intimate partner violence    Fear of current or ex partner: Not on file    Emotionally abused: Not on file    Physically abused: Not on file    Forced sexual activity: Not on file  Other Topics Concern  . Not on file  Social History Narrative   Married.   1 child   Works in [employer]   Enjoys gardening, spending time with her son.     Past Surgical History:  Procedure Laterality Date  . ABDOMINAL HYSTERECTOMY  2007    Family History  Problem Relation Age of Onset  . Heart disease Father   . Hyperlipidemia Mother   . Hypothyroidism Mother  No Known Allergies  Current Outpatient Medications on File Prior to Visit  Medication Sig Dispense Refill  . levothyroxine (SYNTHROID) 50 MCG tablet Take 1 tablet (50 mcg total) by mouth daily before breakfast. NEED APPOINTMENT FOR REFILLS 90 tablet 0   No current facility-administered medications on file prior to visit.     BP 100/64   Pulse 86   Temp 98.2 F (36.8 C) (Temporal)   Ht 5' 4.5" (1.638 m)   Wt 123 lb 8 oz (56 kg)   SpO2 98%   BMI 20.87 kg/m    Objective:   Physical Exam  Constitutional: She is oriented to person, place, and time. She appears well-nourished.  HENT:  Mouth/Throat: No oropharyngeal exudate.  Eyes: Pupils are equal, round, and  reactive to light. EOM are normal.  Neck: Neck supple. No thyromegaly present.  Cardiovascular: Normal rate and regular rhythm.  Respiratory: Effort normal and breath sounds normal.  GI: Soft. Bowel sounds are normal. There is no abdominal tenderness.  Musculoskeletal: Normal range of motion.  Neurological: She is alert and oriented to person, place, and time.  Skin: Skin is warm and dry.  Psychiatric: She has a normal mood and affect.           Assessment & Plan:

## 2019-07-28 NOTE — Assessment & Plan Note (Signed)
Denies concerns today.  

## 2019-07-28 NOTE — Patient Instructions (Signed)
Call the [facility] to schedule your mammogram.  Continue exercising. You should be getting 150 minutes of moderate intensity exercise weekly.  Continue to work on a healthy diet.  Ensure you are consuming 64 ounces of water daily.  Be sure to take your levothyroxine (thyroid medication) every morning on an empty stomach with water only. No food or other medications for 30 minutes. No heartburn medication, iron pills, calcium, vitamin D, or magnesium pills within four hours of taking levothyroxine.   Schedule a lab only appointment for four weeks for thyroid check.  It was a pleasure to see you today!   Preventive Care 54-18 Years Old, Female Preventive care refers to visits with your health care provider and lifestyle choices that can promote health and wellness. This includes:  A yearly physical exam. This may also be called an annual well check.  Regular dental visits and eye exams.  Immunizations.  Screening for certain conditions.  Healthy lifestyle choices, such as eating a healthy diet, getting regular exercise, not using drugs or products that contain nicotine and tobacco, and limiting alcohol use. What can I expect for my preventive care visit? Physical exam Your health care provider will check your:  Height and weight. This may be used to calculate body mass index (BMI), which tells if you are at a healthy weight.  Heart rate and blood pressure.  Skin for abnormal spots. Counseling Your health care provider may ask you questions about your:  Alcohol, tobacco, and drug use.  Emotional well-being.  Home and relationship well-being.  Sexual activity.  Eating habits.  Work and work Statistician.  Method of birth control.  Menstrual cycle.  Pregnancy history. What immunizations do I need?  Influenza (flu) vaccine  This is recommended every year. Tetanus, diphtheria, and pertussis (Tdap) vaccine  You may need a Td booster every 10 years.  Varicella (chickenpox) vaccine  You may need this if you have not been vaccinated. Zoster (shingles) vaccine  You may need this after age 82. Measles, mumps, and rubella (MMR) vaccine  You may need at least one dose of MMR if you were born in 1957 or later. You may also need a second dose. Pneumococcal conjugate (PCV13) vaccine  You may need this if you have certain conditions and were not previously vaccinated. Pneumococcal polysaccharide (PPSV23) vaccine  You may need one or two doses if you smoke cigarettes or if you have certain conditions. Meningococcal conjugate (MenACWY) vaccine  You may need this if you have certain conditions. Hepatitis A vaccine  You may need this if you have certain conditions or if you travel or work in places where you may be exposed to hepatitis A. Hepatitis B vaccine  You may need this if you have certain conditions or if you travel or work in places where you may be exposed to hepatitis B. Haemophilus influenzae type b (Hib) vaccine  You may need this if you have certain conditions. Human papillomavirus (HPV) vaccine  If recommended by your health care provider, you may need three doses over 6 months. You may receive vaccines as individual doses or as more than one vaccine together in one shot (combination vaccines). Talk with your health care provider about the risks and benefits of combination vaccines. What tests do I need? Blood tests  Lipid and cholesterol levels. These may be checked every 5 years, or more frequently if you are over 36 years old.  Hepatitis C test.  Hepatitis B test. Screening  Lung cancer  screening. You may have this screening every year starting at age 54 if you have a 30-pack-year history of smoking and currently smoke or have quit within the past 15 years.  Colorectal cancer screening. All adults should have this screening starting at age 54 and continuing until age 5. Your health care provider may recommend  screening at age 6 if you are at increased risk. You will have tests every 1-10 years, depending on your results and the type of screening test.  Diabetes screening. This is done by checking your blood sugar (glucose) after you have not eaten for a while (fasting). You may have this done every 1-3 years.  Mammogram. This may be done every 1-2 years. Talk with your health care provider about when you should start having regular mammograms. This may depend on whether you have a family history of breast cancer.  BRCA-related cancer screening. This may be done if you have a family history of breast, ovarian, tubal, or peritoneal cancers.  Pelvic exam and Pap test. This may be done every 3 years starting at age 54. Starting at age 54, this may be done every 5 years if you have a Pap test in combination with an HPV test. Other tests  Sexually transmitted disease (STD) testing.  Bone density scan. This is done to screen for osteoporosis. You may have this scan if you are at high risk for osteoporosis. Follow these instructions at home: Eating and drinking  Eat a diet that includes fresh fruits and vegetables, whole grains, lean protein, and low-fat dairy.  Take vitamin and mineral supplements as recommended by your health care provider.  Do not drink alcohol if: ? Your health care provider tells you not to drink. ? You are pregnant, may be pregnant, or are planning to become pregnant.  If you drink alcohol: ? Limit how much you have to 0-1 drink a day. ? Be aware of how much alcohol is in your drink. In the [place] one drink equals one 12 oz bottle of beer (355 mL), one 5 oz glass of wine (148 mL), or one 1 oz glass of hard liquor (44 mL). Lifestyle  Take daily care of your teeth and gums.  Stay active. Exercise for at least 30 minutes on 5 or more days each week.  Do not use any products that contain nicotine or tobacco, such as cigarettes, e-cigarettes, and chewing tobacco. If you need  help quitting, ask your health care provider.  If you are sexually active, practice safe sex. Use a condom or other form of birth control (contraception) in order to prevent pregnancy and STIs (sexually transmitted infections).  If told by your health care provider, take low-dose aspirin daily starting at age 69. What's next?  Visit your health care provider once a year for a well check visit.  Ask your health care provider how often you should have your eyes and teeth checked.  Stay up to date on all vaccines. This information is not intended to replace advice given to you by your health care provider. Make sure you discuss any questions you have with your health care provider. Document Released: 12/29/2015 Document Revised: 08/13/2018 Document Reviewed: 08/13/2018 Elsevier Patient Education  2020 [employer].

## 2019-07-28 NOTE — Assessment & Plan Note (Signed)
Immunizations UTD. Mammogram due, orders placed. Colon cancer screening UTD, due in 2022. Encouraged regular exercise, healthy diet. Exam today benign. Labs reviewed. Follow up in 1 year for CPE.

## 2019-08-01 ENCOUNTER — Other Ambulatory Visit: Payer: Self-pay | Admitting: Primary Care

## 2019-08-01 DIAGNOSIS — E039 Hypothyroidism, unspecified: Secondary | ICD-10-CM

## 2019-08-25 ENCOUNTER — Other Ambulatory Visit (INDEPENDENT_AMBULATORY_CARE_PROVIDER_SITE_OTHER): Payer: BC Managed Care – PPO

## 2019-08-25 DIAGNOSIS — E039 Hypothyroidism, unspecified: Secondary | ICD-10-CM | POA: Diagnosis not present

## 2019-08-25 LAB — TSH: TSH: 3.07 u[IU]/mL (ref 0.35–4.50)

## 2019-08-29 ENCOUNTER — Other Ambulatory Visit: Payer: Self-pay | Admitting: Primary Care

## 2019-08-29 DIAGNOSIS — E039 Hypothyroidism, unspecified: Secondary | ICD-10-CM

## 2019-10-14 ENCOUNTER — Ambulatory Visit (INDEPENDENT_AMBULATORY_CARE_PROVIDER_SITE_OTHER): Payer: BC Managed Care – PPO

## 2019-10-14 DIAGNOSIS — Z23 Encounter for immunization: Secondary | ICD-10-CM | POA: Diagnosis not present

## 2020-02-24 ENCOUNTER — Other Ambulatory Visit: Payer: Self-pay | Admitting: Anesthesiology

## 2020-02-24 DIAGNOSIS — Z1231 Encounter for screening mammogram for malignant neoplasm of breast: Secondary | ICD-10-CM

## 2020-02-29 ENCOUNTER — Ambulatory Visit
Admission: RE | Admit: 2020-02-29 | Discharge: 2020-02-29 | Disposition: A | Discharge status: Home / Self Care | Payer: BC Managed Care – PPO | Source: Ambulatory Visit

## 2020-02-29 ENCOUNTER — Other Ambulatory Visit: Payer: Self-pay

## 2020-02-29 DIAGNOSIS — Z1231 Encounter for screening mammogram for malignant neoplasm of breast: Secondary | ICD-10-CM | POA: Diagnosis not present

## 2020-03-01 ENCOUNTER — Other Ambulatory Visit: Payer: Self-pay | Admitting: Primary Care

## 2020-03-01 ENCOUNTER — Other Ambulatory Visit: Payer: Self-pay | Admitting: Anesthesiology

## 2020-03-01 ENCOUNTER — Other Ambulatory Visit: Payer: Self-pay | Admitting: *Deleted

## 2020-03-01 DIAGNOSIS — R928 Other abnormal and inconclusive findings on diagnostic imaging of breast: Secondary | ICD-10-CM

## 2020-03-17 ENCOUNTER — Ambulatory Visit
Admission: RE | Admit: 2020-03-17 | Discharge: 2020-03-17 | Disposition: A | Discharge status: Home / Self Care | Payer: BC Managed Care – PPO | Source: Ambulatory Visit | Attending: Primary Care | Admitting: Primary Care

## 2020-03-17 ENCOUNTER — Other Ambulatory Visit: Payer: Self-pay

## 2020-03-17 ENCOUNTER — Other Ambulatory Visit: Payer: Self-pay | Admitting: Primary Care

## 2020-03-17 ENCOUNTER — Ambulatory Visit: Payer: BC Managed Care – PPO

## 2020-03-17 DIAGNOSIS — R928 Other abnormal and inconclusive findings on diagnostic imaging of breast: Secondary | ICD-10-CM

## 2020-03-17 DIAGNOSIS — R922 Inconclusive mammogram: Secondary | ICD-10-CM | POA: Diagnosis not present

## 2020-05-25 ENCOUNTER — Other Ambulatory Visit: Payer: Self-pay | Admitting: Primary Care

## 2020-05-25 DIAGNOSIS — E039 Hypothyroidism, unspecified: Secondary | ICD-10-CM

## 2020-08-15 ENCOUNTER — Other Ambulatory Visit: Payer: Self-pay | Admitting: Primary Care

## 2020-08-15 DIAGNOSIS — E039 Hypothyroidism, unspecified: Secondary | ICD-10-CM

## 2020-08-15 DIAGNOSIS — Z0000 Encounter for general adult medical examination without abnormal findings: Secondary | ICD-10-CM

## 2020-08-15 DIAGNOSIS — Z1159 Encounter for screening for other viral diseases: Secondary | ICD-10-CM

## 2020-08-15 DIAGNOSIS — Z114 Encounter for screening for human immunodeficiency virus [HIV]: Secondary | ICD-10-CM

## 2020-08-15 DIAGNOSIS — Z131 Encounter for screening for diabetes mellitus: Secondary | ICD-10-CM

## 2020-08-23 ENCOUNTER — Other Ambulatory Visit: Payer: Self-pay | Admitting: Primary Care

## 2020-08-23 ENCOUNTER — Other Ambulatory Visit (INDEPENDENT_AMBULATORY_CARE_PROVIDER_SITE_OTHER): Payer: BC Managed Care – PPO

## 2020-08-23 ENCOUNTER — Other Ambulatory Visit: Payer: Self-pay

## 2020-08-23 DIAGNOSIS — Z1159 Encounter for screening for other viral diseases: Secondary | ICD-10-CM

## 2020-08-23 DIAGNOSIS — Z131 Encounter for screening for diabetes mellitus: Secondary | ICD-10-CM

## 2020-08-23 DIAGNOSIS — Z114 Encounter for screening for human immunodeficiency virus [HIV]: Secondary | ICD-10-CM

## 2020-08-23 DIAGNOSIS — Z0000 Encounter for general adult medical examination without abnormal findings: Secondary | ICD-10-CM | POA: Diagnosis not present

## 2020-08-23 DIAGNOSIS — E039 Hypothyroidism, unspecified: Secondary | ICD-10-CM

## 2020-08-23 LAB — CBC
HCT: 39.7 % (ref 36.0–46.0)
Hemoglobin: 13.5 g/dL (ref 12.0–15.0)
MCHC: 34 g/dL (ref 30.0–36.0)
MCV: 89.7 fl (ref 78.0–100.0)
Platelets: 281 10*3/uL (ref 150.0–400.0)
RBC: 4.43 Mil/uL (ref 3.87–5.11)
RDW: 13 % (ref 11.5–15.5)
WBC: 5.2 10*3/uL (ref 4.0–10.5)

## 2020-08-23 LAB — COMPREHENSIVE METABOLIC PANEL
ALT: 9 U/L (ref 0–35)
AST: 16 U/L (ref 0–37)
Albumin: 4.2 g/dL (ref 3.5–5.2)
Alkaline Phosphatase: 59 U/L (ref 39–117)
BUN: 19 mg/dL (ref 6–23)
CO2: 30 mEq/L (ref 19–32)
Calcium: 9.2 mg/dL (ref 8.4–10.5)
Chloride: 104 mEq/L (ref 96–112)
Creatinine, Ser: 0.93 mg/dL (ref 0.40–1.20)
GFR: 62.55 mL/min (ref >60.00)
Glucose, Bld: 88 mg/dL (ref 70–99)
Potassium: 4.4 mEq/L (ref 3.5–5.1)
Sodium: 139 mEq/L (ref 135–145)
Total Bilirubin: 0.7 mg/dL (ref 0.2–1.2)
Total Protein: 6.4 g/dL (ref 6.0–8.3)

## 2020-08-23 LAB — LIPID PANEL
Cholesterol: 196 mg/dL (ref 0–200)
HDL: 62.8 mg/dL (ref >39.00)
LDL Cholesterol: 119 mg/dL — ABNORMAL HIGH (ref 0–99)
NonHDL: 133.31
Total CHOL/HDL Ratio: 3
Triglycerides: 73 mg/dL (ref 0.0–149.0)
VLDL: 14.6 mg/dL (ref 0.0–40.0)

## 2020-08-23 LAB — HEMOGLOBIN A1C: Hgb A1c MFr Bld: 5.6 % (ref 4.6–6.5)

## 2020-08-23 LAB — TSH: TSH: 4.92 u[IU]/mL — ABNORMAL HIGH (ref 0.35–4.50)

## 2020-08-24 LAB — HEPATITIS C ANTIBODY
Hepatitis C Ab: NONREACTIVE
SIGNAL TO CUT-OFF: 0.01 (ref <1.00)

## 2020-08-24 LAB — HIV ANTIBODY (ROUTINE TESTING W REFLEX): HIV 1&2 Ab, 4th Generation: NONREACTIVE

## 2020-08-30 ENCOUNTER — Other Ambulatory Visit: Payer: Self-pay

## 2020-08-30 ENCOUNTER — Encounter: Payer: Self-pay | Admitting: Primary Care

## 2020-08-30 ENCOUNTER — Ambulatory Visit: Payer: BC Managed Care – PPO | Admitting: Primary Care

## 2020-08-30 VITALS — BP 108/62 | HR 81 | Temp 97.6°F | Ht 64.0 in | Wt 122.0 lb

## 2020-08-30 DIAGNOSIS — E039 Hypothyroidism, unspecified: Secondary | ICD-10-CM

## 2020-08-30 DIAGNOSIS — N952 Postmenopausal atrophic vaginitis: Secondary | ICD-10-CM

## 2020-08-30 DIAGNOSIS — R053 Chronic cough: Secondary | ICD-10-CM

## 2020-08-30 DIAGNOSIS — R05 Cough: Secondary | ICD-10-CM

## 2020-08-30 DIAGNOSIS — Z23 Encounter for immunization: Secondary | ICD-10-CM | POA: Diagnosis not present

## 2020-08-30 DIAGNOSIS — Z0000 Encounter for general adult medical examination without abnormal findings: Secondary | ICD-10-CM | POA: Diagnosis not present

## 2020-08-30 MED ORDER — ESTRADIOL 0.1 MG/GM VA CREA: TOPICAL_CREAM | VAGINAL | 0 refills | Status: DC

## 2020-08-30 NOTE — Assessment & Plan Note (Addendum)
She is taking levothyroxine correctly, however TSH above goal at 4.9.  Interestingly enough the same situation occurred last year where TSH was over 7, repeat TSH 1 month later within normal range.  She is asymptomatic. Start with repeat TSH in 2 weeks, consider dose increase to 75 mcg if warranted.

## 2020-08-30 NOTE — Patient Instructions (Addendum)
Continue to work on a healthy diet and regular exercise. Ensure you are consuming 64 ounces of water daily.  You may use the estrogen vaginal cream once to three times weekly for vaginal dryness/burning.   Schedule a lab appointment for 1-2 weeks to repeat your thyroid function.  It was a pleasure to see you today!   Preventive Care 68-55 Years Old, Female Preventive care refers to visits with your health care provider and lifestyle choices that can promote health and wellness. This includes:  A yearly physical exam. This may also be called an annual well check.  Regular dental visits and eye exams.  Immunizations.  Screening for certain conditions.  Healthy lifestyle choices, such as eating a healthy diet, getting regular exercise, not using drugs or products that contain nicotine and tobacco, and limiting alcohol use. What can I expect for my preventive care visit? Physical exam Your health care provider will check your:  Height and weight. This may be used to calculate body mass index (BMI), which tells if you are at a healthy weight.  Heart rate and blood pressure.  Skin for abnormal spots. Counseling Your health care provider may ask you questions about your:  Alcohol, tobacco, and drug use.  Emotional well-being.  Home and relationship well-being.  Sexual activity.  Eating habits.  Work and work Statistician.  Method of birth control.  Menstrual cycle.  Pregnancy history. What immunizations do I need?  Influenza (flu) vaccine  This is recommended every year. Tetanus, diphtheria, and pertussis (Tdap) vaccine  You may need a Td booster every 10 years. Varicella (chickenpox) vaccine  You may need this if you have not been vaccinated. Zoster (shingles) vaccine  You may need this after age 53. Measles, mumps, and rubella (MMR) vaccine  You may need at least one dose of MMR if you were born in 1957 or later. You may also need a second  dose. Pneumococcal conjugate (PCV13) vaccine  You may need this if you have certain conditions and were not previously vaccinated. Pneumococcal polysaccharide (PPSV23) vaccine  You may need one or two doses if you smoke cigarettes or if you have certain conditions. Meningococcal conjugate (MenACWY) vaccine  You may need this if you have certain conditions. Hepatitis A vaccine  You may need this if you have certain conditions or if you travel or work in places where you may be exposed to hepatitis A. Hepatitis B vaccine  You may need this if you have certain conditions or if you travel or work in places where you may be exposed to hepatitis B. Haemophilus influenzae type b (Hib) vaccine  You may need this if you have certain conditions. Human papillomavirus (HPV) vaccine  If recommended by your health care provider, you may need three doses over 6 months. You may receive vaccines as individual doses or as more than one vaccine together in one shot (combination vaccines). Talk with your health care provider about the risks and benefits of combination vaccines. What tests do I need? Blood tests  Lipid and cholesterol levels. These may be checked every 5 years, or more frequently if you are over 75 years old.  Hepatitis C test.  Hepatitis B test. Screening  Lung cancer screening. You may have this screening every year starting at age 53 if you have a 30-pack-year history of smoking and currently smoke or have quit within the past 15 years.  Colorectal cancer screening. All adults should have this screening starting at age 6 and continuing until  age 75. Your health care provider may recommend screening at age 64 if you are at increased risk. You will have tests every 1-10 years, depending on your results and the type of screening test.  Diabetes screening. This is done by checking your blood sugar (glucose) after you have not eaten for a while (fasting). You may have this done every  1-3 years.  Mammogram. This may be done every 1-2 years. Talk with your health care provider about when you should start having regular mammograms. This may depend on whether you have a family history of breast cancer.  BRCA-related cancer screening. This may be done if you have a family history of breast, ovarian, tubal, or peritoneal cancers.  Pelvic exam and Pap test. This may be done every 3 years starting at age 9. Starting at age 42, this may be done every 5 years if you have a Pap test in combination with an HPV test. Other tests  Sexually transmitted disease (STD) testing.  Bone density scan. This is done to screen for osteoporosis. You may have this scan if you are at high risk for osteoporosis. Follow these instructions at home: Eating and drinking  Eat a diet that includes fresh fruits and vegetables, whole grains, lean protein, and low-fat dairy.  Take vitamin and mineral supplements as recommended by your health care provider.  Do not drink alcohol if: ? Your health care provider tells you not to drink. ? You are pregnant, may be pregnant, or are planning to become pregnant.  If you drink alcohol: ? Limit how much you have to 0-1 drink a day. ? Be aware of how much alcohol is in your drink. In the [place] one drink equals one 12 oz bottle of beer (355 mL), one 5 oz glass of wine (148 mL), or one 1 oz glass of hard liquor (44 mL). Lifestyle  Take daily care of your teeth and gums.  Stay active. Exercise for at least 30 minutes on 5 or more days each week.  Do not use any products that contain nicotine or tobacco, such as cigarettes, e-cigarettes, and chewing tobacco. If you need help quitting, ask your health care provider.  If you are sexually active, practice safe sex. Use a condom or other form of birth control (contraception) in order to prevent pregnancy and STIs (sexually transmitted infections).  If told by your health care provider, take low-dose aspirin daily  starting at age 85. What's next?  Visit your health care provider once a year for a well check visit.  Ask your health care provider how often you should have your eyes and teeth checked.  Stay up to date on all vaccines. This information is not intended to replace advice given to you by your health care provider. Make sure you discuss any questions you have with your health care provider. Document Revised: 08/13/2018 Document Reviewed: 08/13/2018 Elsevier Patient Education  [place].   Influenza (Flu) Vaccine (Inactivated or Recombinant): What You Need to Know 1. Why get vaccinated? Influenza vaccine can prevent influenza (flu). Flu is a contagious disease that spreads around the [place] every year, usually between October and May. Anyone can get the flu, but it is more dangerous for some people. Infants and young children, people 87 years of age and older, pregnant women, and people with certain health conditions or a weakened immune system are at greatest risk of flu complications. Pneumonia, bronchitis, sinus infections and ear infections are examples of flu-related complications. If  you have a medical condition, such as heart disease, cancer or diabetes, flu can make it worse. Flu can cause fever and chills, sore throat, muscle aches, fatigue, cough, headache, and runny or stuffy nose. Some people may have vomiting and diarrhea, though this is more common in children than adults. Each year thousands of people in the [place] die from flu, and many more are hospitalized. Flu vaccine prevents millions of illnesses and flu-related visits to the doctor each year. 2. Influenza vaccine CDC recommends everyone 33 months of age and older get vaccinated every flu season. Children 6 months through 41 years of age may need 2 doses during a single flu season. Everyone else needs only 1 dose each flu season. It takes about 2 weeks for protection to develop after vaccination. There  are many flu viruses, and they are always changing. Each year a new flu vaccine is made to protect against three or four viruses that are likely to cause disease in the upcoming flu season. Even when the vaccine doesn't exactly match these viruses, it may still provide some protection. Influenza vaccine does not cause flu. Influenza vaccine may be given at the same time as other vaccines. 3. Talk with your health care provider Tell your vaccine provider if the person getting the vaccine:  Has had an allergic reaction after a previous dose of influenza vaccine, or has any severe, life-threatening allergies.  Has ever had Guillain-Barr Syndrome (also called GBS). In some cases, your health care provider may decide to postpone influenza vaccination to a future visit. People with minor illnesses, such as a cold, may be vaccinated. People who are moderately or severely ill should usually wait until they recover before getting influenza vaccine. Your health care provider can give you more information. 4. Risks of a vaccine reaction  Soreness, redness, and swelling where shot is given, fever, muscle aches, and headache can happen after influenza vaccine.  There may be a very small increased risk of Guillain-Barr Syndrome (GBS) after inactivated influenza vaccine (the flu shot). Young children who get the flu shot along with pneumococcal vaccine (PCV13), and/or DTaP vaccine at the same time might be slightly more likely to have a seizure caused by fever. Tell your health care provider if a child who is getting flu vaccine has ever had a seizure. People sometimes faint after medical procedures, including vaccination. Tell your provider if you feel dizzy or have vision changes or ringing in the ears. As with any medicine, there is a very remote chance of a vaccine causing a severe allergic reaction, other serious injury, or death. 5. What if there is a serious problem? An allergic reaction could occur  after the vaccinated person leaves the clinic. If you see signs of a severe allergic reaction (hives, swelling of the face and throat, difficulty breathing, a fast heartbeat, dizziness, or weakness), call 9-1-1 and get the person to the nearest hospital. For other signs that concern you, call your health care provider. Adverse reactions should be reported to the Vaccine Adverse Event Reporting System (VAERS). Your health care provider will usually file this report, or you can do it yourself. Visit the VAERS website at www.vaers.SamedayNews.es or call 803-530-1827.VAERS is only for reporting reactions, and VAERS staff do not give medical advice. 6. The National Vaccine Injury Compensation Program The [employer] Vaccine Injury Compensation Program (VICP) is a federal program that was created to compensate people who may have been injured by certain vaccines. Visit the VICP website at GoldCloset.com.ee  or call (281)549-0651 to learn about the program and about filing a claim. There is a time limit to file a claim for compensation. 7. How can I learn more?  Ask your healthcare provider.  Call your local or state health department.  Contact the Centers for Disease Control and Prevention (CDC): ? Call 914-380-2729 (1-800-CDC-INFO) or ? Visit CDC's https://gibson.com/ Vaccine Information Statement (Interim) Inactivated Influenza Vaccine (07/30/2018) This information is not intended to replace advice given to you by your health care provider. Make sure you discuss any questions you have with your health care provider. Document Revised: 03/23/2019 Document Reviewed: 08/03/2018 Elsevier Patient Education  [place].

## 2020-08-30 NOTE — Progress Notes (Signed)
Subjective:    Patient ID: Destiny Leon, female    DOB: December 20, 1965, 54 y.o.   MRN: [id number]  HPI  This visit occurred during the SARS-CoV-2 public health emergency.  Safety protocols were in place, including screening questions prior to the visit, additional usage of staff PPE, and extensive cleaning of exam room while observing appropriate contact time as indicated for disinfecting solutions.   Ms. Destiny Leon is a 54 year old who presents today for complete physical.  She would also like to discuss vaginal burning which began several years ago. She will experience vaginal burning with shaving creams, soaps, during intercourse.  She has been reading about topical estrogen and is interested.  Immunizations: -Tetanus: Completed in 2018 -Influenza: Due this season  -Shingles: Never completed  -Covid-19: Completed series   Diet: She endorses a healthy diet.  Exercise: She is does an exercise program three days week.   Eye exam: Completed in 2021 Dental exam: Completes semi-annually   Pap Smear: Hysterectomy  Mammogram: Completed last in April 2021 Colonoscopy: Completed Cologuard in 2019 Hep C Screen:  BP Readings from Last 3 Encounters:  08/30/20 108/62  07/28/19 100/64  04/27/18 110/70     Review of Systems  Constitutional: Negative for unexpected weight change.  HENT: Negative for rhinorrhea.   Eyes: Negative for visual disturbance.  Respiratory: Negative for cough and shortness of breath.   Cardiovascular: Negative for chest pain.  Gastrointestinal: Negative for constipation and diarrhea.  Genitourinary: Negative for difficulty urinating.  Musculoskeletal: Negative for arthralgias and myalgias.  Skin: Negative for rash.  Allergic/Immunologic: Negative for environmental allergies.  Neurological: Negative for dizziness, numbness and headaches.  Psychiatric/Behavioral: The patient is not nervous/anxious.        Past Medical History:  Diagnosis Date  . Hypothyroidism       Social History   Socioeconomic History  . Marital status: Married    Spouse name: Not on file  . Number of children: Not on file  . Years of education: Not on file  . Highest education level: Not on file  Occupational History  . Not on file  Tobacco Use  . Smoking status: Never Smoker  . Smokeless tobacco: Never Used  Substance and Sexual Activity  . Alcohol use: No    Alcohol/week: 0.0 standard drinks  . Drug use: No  . Sexual activity: Not on file  Other Topics Concern  . Not on file  Social History Narrative   Married.   1 child   Works in [employer]   Enjoys gardening, spending time with her son.    Social Determinants of Health   Financial Resource Strain:   . Difficulty of Paying Living Expenses: Not on file  Food Insecurity:   . Worried About Programme researcher, broadcasting/film/video in the Last Year: Not on file  . Ran Out of Food in the Last Year: Not on file  Transportation Needs:   . Lack of Transportation (Medical): Not on file  . Lack of Transportation (Non-Medical): Not on file  Physical Activity:   . Days of Exercise per Week: Not on file  . Minutes of Exercise per Session: Not on file  Stress:   . Feeling of Stress : Not on file  Social Connections:   . Frequency of Communication with Friends and Family: Not on file  . Frequency of Social Gatherings with Friends and Family: Not on file  . Attends Religious Services: Not on file  . Active Member of Clubs or  Organizations: Not on file  . Attends Banker Meetings: Not on file  . Marital Status: Not on file  Intimate Partner Violence:   . Fear of Current or Ex-Partner: Not on file  . Emotionally Abused: Not on file  . Physically Abused: Not on file  . Sexually Abused: Not on file    Past Surgical History:  Procedure Laterality Date  . ABDOMINAL HYSTERECTOMY  2007    Family History  Problem Relation Age of Onset  . Heart disease Father   . Hyperlipidemia Mother   . Hypothyroidism Mother      No Known Allergies  Current Outpatient Medications on File Prior to Visit  Medication Sig Dispense Refill  . levothyroxine (SYNTHROID) 50 MCG tablet TAKE 1 TABLET EVERY MORNING ON AN EMPTY STOMACH. NO FOOD OR OTHER MEDICATIONS FOR 30 MINUTES. 90 tablet 0   No current facility-administered medications on file prior to visit.    BP 108/62   Pulse 81   Temp 97.6 F (36.4 C) (Temporal)   Ht 5\' 4"  (1.626 m)   Wt 122 lb (55.3 kg)   SpO2 97%   BMI 20.94 kg/m    Objective:   Physical Exam HENT:     Right Ear: Tympanic membrane and ear canal normal.     Left Ear: Tympanic membrane and ear canal normal.  Eyes:     Pupils: Pupils are equal, round, and reactive to light.  Cardiovascular:     Rate and Rhythm: Normal rate and regular rhythm.  Pulmonary:     Effort: Pulmonary effort is normal.     Breath sounds: Normal breath sounds.  Abdominal:     General: Bowel sounds are normal.     Palpations: Abdomen is soft.     Tenderness: There is no abdominal tenderness.  Musculoskeletal:        General: Normal range of motion.     Cervical back: Neck supple.  Skin:    General: Skin is warm and dry.  Neurological:     Mental Status: She is alert and oriented to person, place, and time.     Cranial Nerves: No cranial nerve deficit.     Deep Tendon Reflexes:     Reflex Scores:      Patellar reflexes are 2+ on the right side and 2+ on the left side. Psychiatric:        Mood and Affect: Mood normal.            Assessment & Plan:

## 2020-08-30 NOTE — Assessment & Plan Note (Signed)
Immunizations up-to-date.  She does not believe she ever had chickenpox. Mammogram up-to-date. Colon cancer screening up-to-date, due in 2022. Commended her on a healthy diet regular exercise.  Exam today benign. Labs reviewed.

## 2020-08-30 NOTE — Assessment & Plan Note (Signed)
Continued, improved with antihistamine use.  She will resume use.

## 2020-09-18 ENCOUNTER — Other Ambulatory Visit: Payer: BC Managed Care – PPO

## 2020-09-18 ENCOUNTER — Other Ambulatory Visit: Payer: Self-pay | Admitting: Primary Care

## 2020-09-18 DIAGNOSIS — E039 Hypothyroidism, unspecified: Secondary | ICD-10-CM

## 2020-09-20 ENCOUNTER — Other Ambulatory Visit: Payer: Self-pay | Admitting: Primary Care

## 2020-09-20 ENCOUNTER — Other Ambulatory Visit: Payer: Self-pay

## 2020-09-20 ENCOUNTER — Other Ambulatory Visit (INDEPENDENT_AMBULATORY_CARE_PROVIDER_SITE_OTHER): Payer: BC Managed Care – PPO

## 2020-09-20 DIAGNOSIS — E039 Hypothyroidism, unspecified: Secondary | ICD-10-CM

## 2020-09-20 LAB — TSH: TSH: 5.42 u[IU]/mL — ABNORMAL HIGH (ref 0.35–4.50)

## 2020-09-20 MED ORDER — LEVOTHYROXINE SODIUM 75 MCG PO TABS: ORAL_TABLET | ORAL | 0 refills | Status: DC

## 2020-10-04 ENCOUNTER — Other Ambulatory Visit: Payer: Self-pay

## 2020-10-04 DIAGNOSIS — E039 Hypothyroidism, unspecified: Secondary | ICD-10-CM

## 2020-10-23 ENCOUNTER — Other Ambulatory Visit: Payer: Self-pay | Admitting: Primary Care

## 2020-10-23 DIAGNOSIS — E039 Hypothyroidism, unspecified: Secondary | ICD-10-CM

## 2020-11-13 ENCOUNTER — Other Ambulatory Visit: Payer: Self-pay

## 2020-11-13 ENCOUNTER — Other Ambulatory Visit (INDEPENDENT_AMBULATORY_CARE_PROVIDER_SITE_OTHER): Payer: BC Managed Care – PPO

## 2020-11-13 DIAGNOSIS — E039 Hypothyroidism, unspecified: Secondary | ICD-10-CM

## 2020-11-13 LAB — TSH: TSH: 1.1 u[IU]/mL (ref 0.35–4.50)

## 2020-12-20 ENCOUNTER — Other Ambulatory Visit: Payer: Self-pay

## 2020-12-20 DIAGNOSIS — E039 Hypothyroidism, unspecified: Secondary | ICD-10-CM

## 2020-12-20 MED ORDER — LEVOTHYROXINE SODIUM 75 MCG PO TABS: ORAL_TABLET | ORAL | 2 refills | Status: DC

## 2021-02-13 ENCOUNTER — Other Ambulatory Visit: Payer: Self-pay | Admitting: Primary Care

## 2021-02-13 DIAGNOSIS — E039 Hypothyroidism, unspecified: Secondary | ICD-10-CM

## 2021-02-21 ENCOUNTER — Other Ambulatory Visit: Payer: Self-pay

## 2021-02-21 ENCOUNTER — Other Ambulatory Visit: Payer: BC Managed Care – PPO

## 2021-02-21 ENCOUNTER — Other Ambulatory Visit (INDEPENDENT_AMBULATORY_CARE_PROVIDER_SITE_OTHER): Payer: BC Managed Care – PPO

## 2021-02-21 DIAGNOSIS — E039 Hypothyroidism, unspecified: Secondary | ICD-10-CM | POA: Diagnosis not present

## 2021-02-21 LAB — TSH: TSH: 0.7 u[IU]/mL (ref 0.35–4.50)

## 2021-06-03 IMAGING — MG DIGITAL SCREENING BILAT W/ CAD
8 series · 8 of 8 positions shown · non-contrast
Comparison: Previous exam(s).

CLINICAL DATA: Screening.

EXAM:
DIGITAL SCREENING BILATERAL MAMMOGRAM WITH CAD
The patient has retropectoral implants. Standard and implant
displaced views were performed.

[L MLO (1 of 2)]
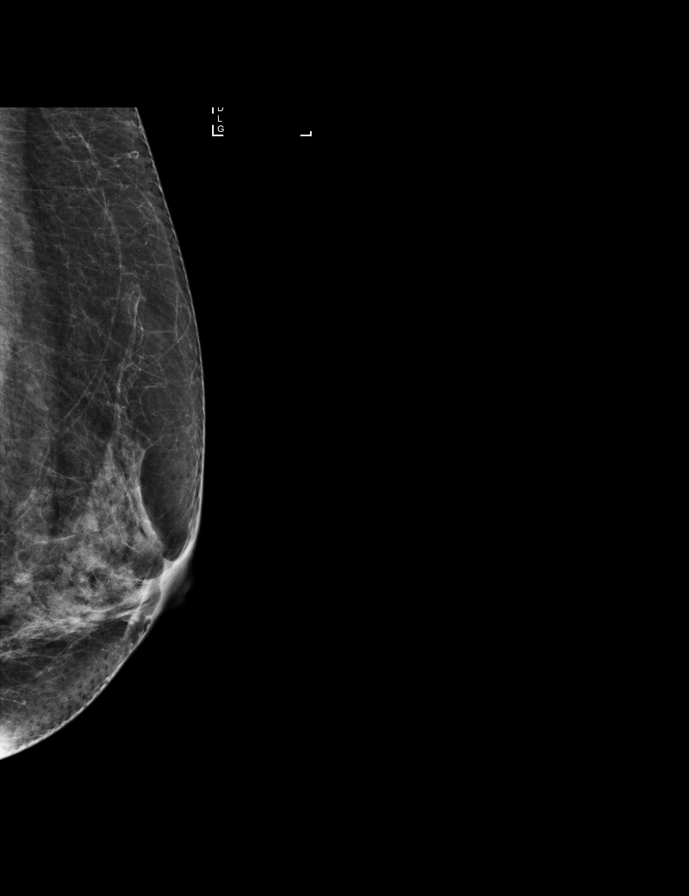

[R MLO (1 of 2)]
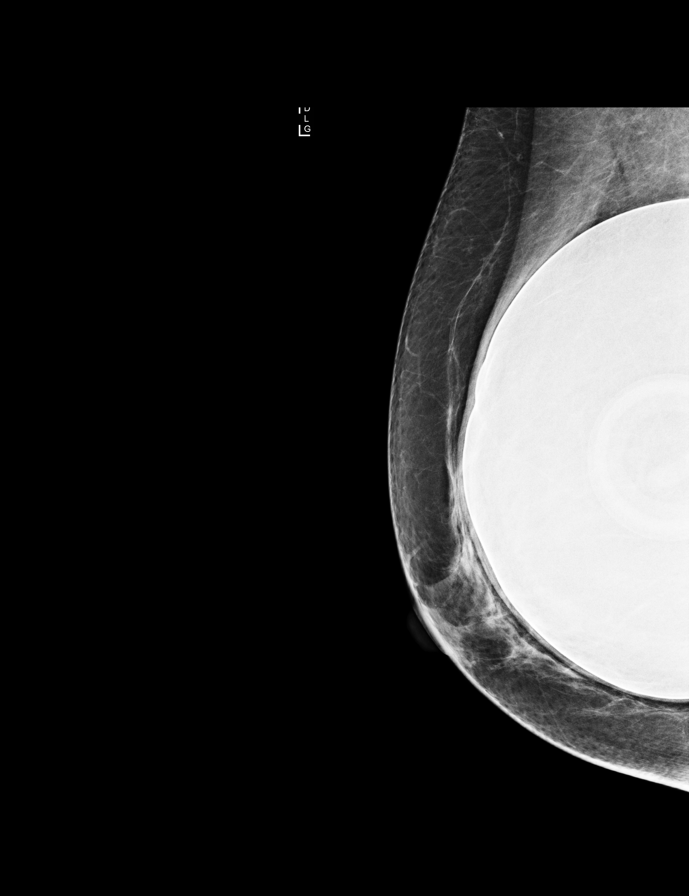

[L MLO (2 of 2)]
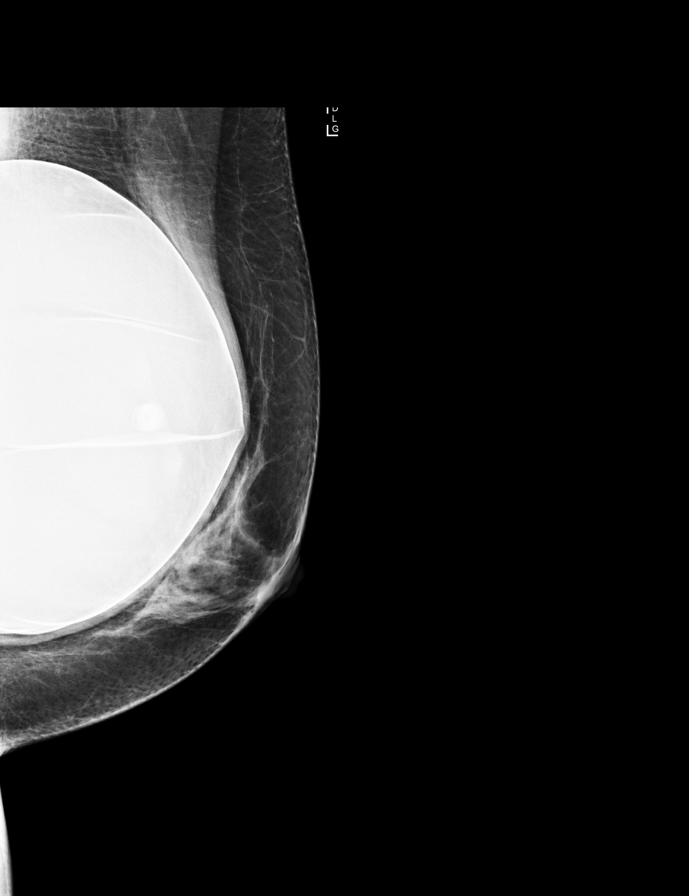

[R MLO (2 of 2)]
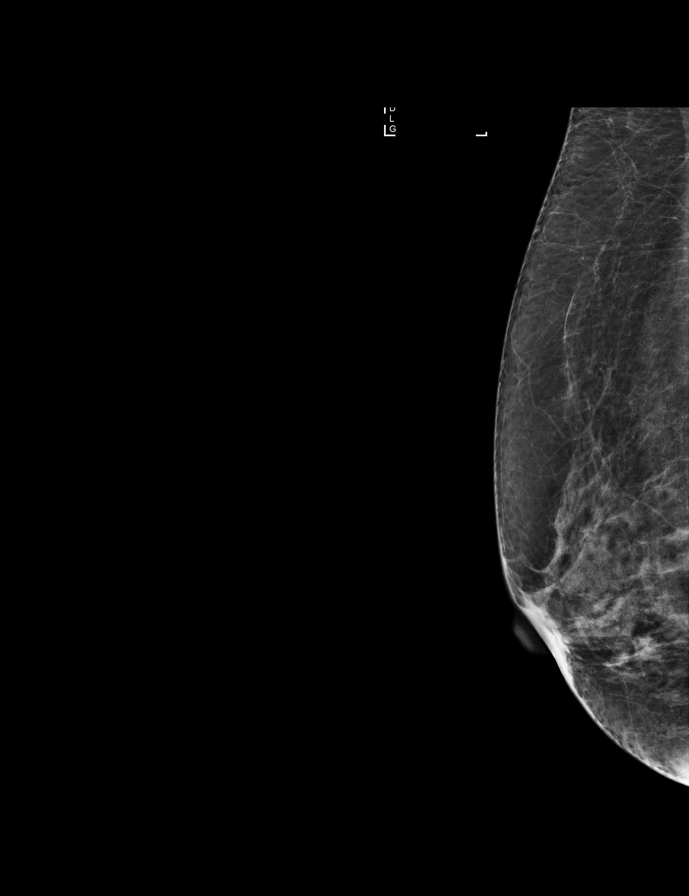

[L CC (1 of 2)]
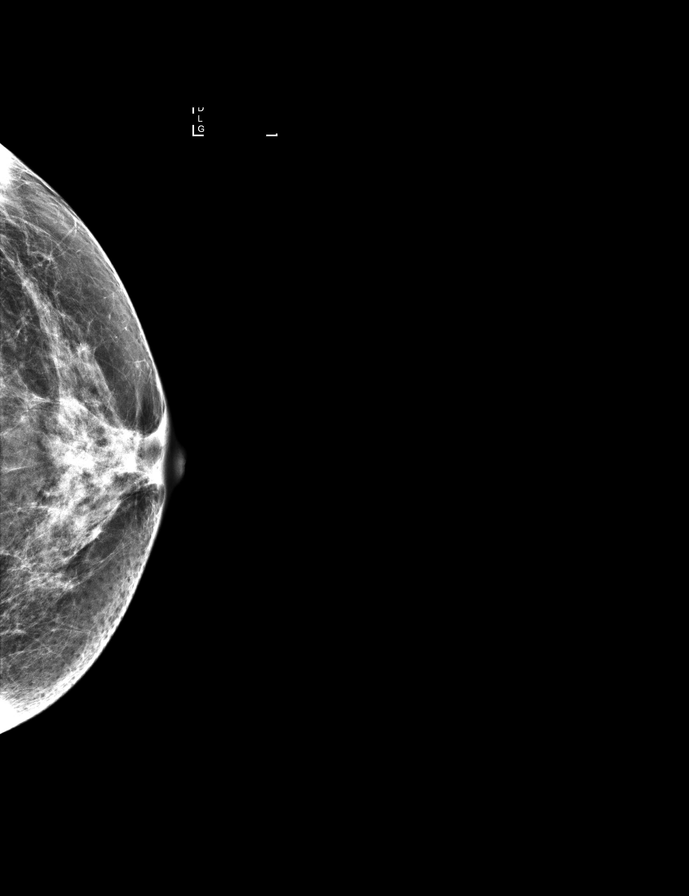

[R CC (1 of 2)]
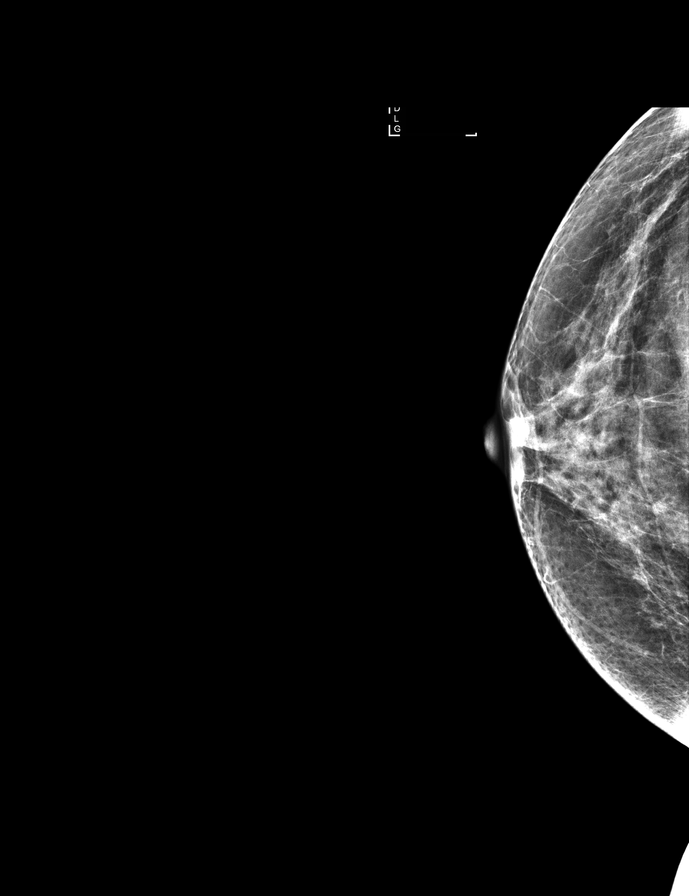

[L CC (2 of 2)]
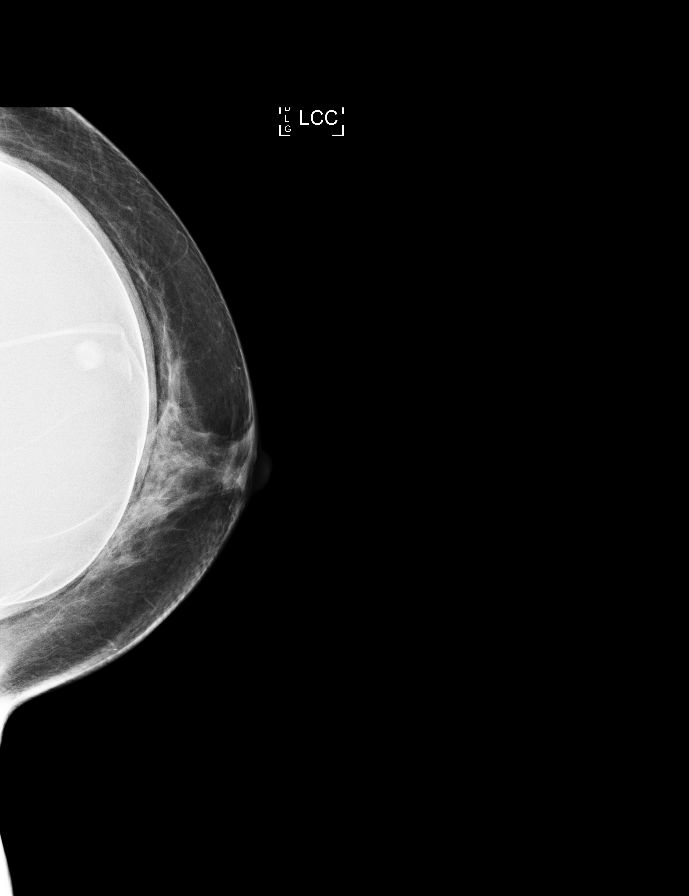

[R CC (2 of 2)]
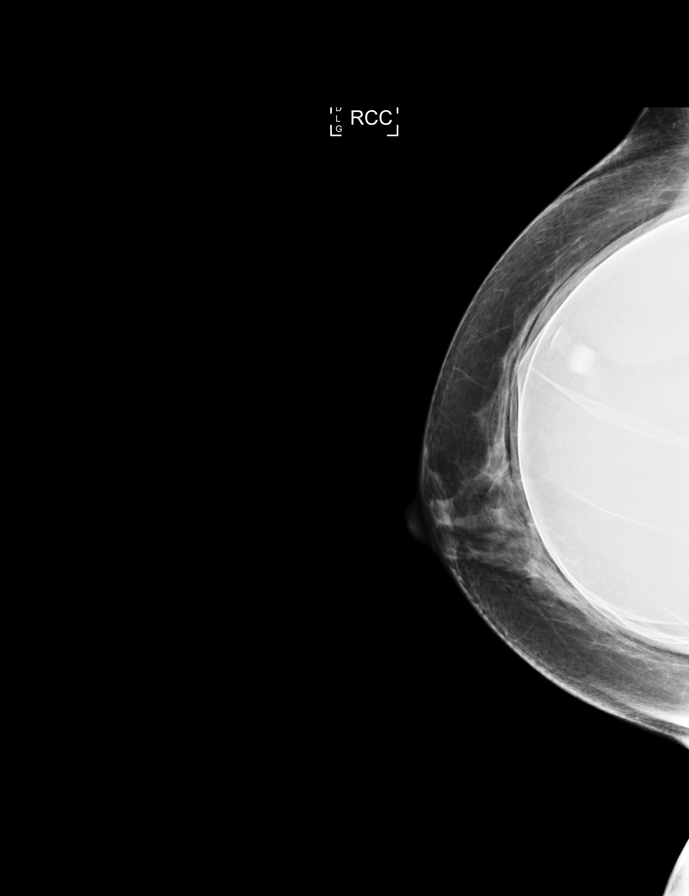

[8 of 8 positions shown; findings below may reference images not displayed]

ACR Breast Density Category c: The breast tissue is heterogeneously
dense, which may obscure small masses.
FINDINGS: In the right breast a possible asymmetry seen only on the CC implant
displaced view requires further evaluation.

In the left breast a possible asymmetry seen only in the MLO implant
displaced view requires further evaluation.

Images were processed with CAD.
IMPRESSION: Further evaluation is suggested for possible asymmetry in the right
breast.

Further evaluation is suggested for possible asymmetry in the left
breast.

The patient will be contacted regarding the findings, and additional
imaging will be scheduled.

RECOMMENDATION:
Diagnostic mammogram and possibly ultrasound of both breasts.
(Code:[id number])

BI-RADS CATEGORY  0: Incomplete. Need additional imaging evaluation
and/or prior mammograms for comparison.

## 2021-08-29 ENCOUNTER — Other Ambulatory Visit: Payer: Self-pay | Admitting: Primary Care

## 2021-08-29 DIAGNOSIS — E039 Hypothyroidism, unspecified: Secondary | ICD-10-CM

## 2021-10-18 ENCOUNTER — Ambulatory Visit (INDEPENDENT_AMBULATORY_CARE_PROVIDER_SITE_OTHER): Payer: BC Managed Care – PPO | Admitting: Primary Care

## 2021-10-18 ENCOUNTER — Encounter: Payer: Self-pay | Admitting: Primary Care

## 2021-10-18 ENCOUNTER — Other Ambulatory Visit: Payer: Self-pay

## 2021-10-18 VITALS — BP 102/74 | HR 71 | Temp 97.7°F | Ht 64.5 in | Wt 125.0 lb

## 2021-10-18 DIAGNOSIS — Z1231 Encounter for screening mammogram for malignant neoplasm of breast: Secondary | ICD-10-CM

## 2021-10-18 DIAGNOSIS — N952 Postmenopausal atrophic vaginitis: Secondary | ICD-10-CM | POA: Insufficient documentation

## 2021-10-18 DIAGNOSIS — E039 Hypothyroidism, unspecified: Secondary | ICD-10-CM

## 2021-10-18 DIAGNOSIS — Z0000 Encounter for general adult medical examination without abnormal findings: Secondary | ICD-10-CM | POA: Diagnosis not present

## 2021-10-18 DIAGNOSIS — K130 Diseases of lips: Secondary | ICD-10-CM

## 2021-10-18 DIAGNOSIS — Z23 Encounter for immunization: Secondary | ICD-10-CM

## 2021-10-18 DIAGNOSIS — R053 Chronic cough: Secondary | ICD-10-CM | POA: Diagnosis not present

## 2021-10-18 DIAGNOSIS — Z1211 Encounter for screening for malignant neoplasm of colon: Secondary | ICD-10-CM

## 2021-10-18 LAB — COMPREHENSIVE METABOLIC PANEL
ALT: 12 U/L (ref 0–35)
AST: 18 U/L (ref 0–37)
Albumin: 4.4 g/dL (ref 3.5–5.2)
Alkaline Phosphatase: 64 U/L (ref 39–117)
BUN: 17 mg/dL (ref 6–23)
CO2: 28 mEq/L (ref 19–32)
Calcium: 9.4 mg/dL (ref 8.4–10.5)
Chloride: 105 mEq/L (ref 96–112)
Creatinine, Ser: 0.85 mg/dL (ref 0.40–1.20)
GFR: 76.63 mL/min (ref >60.00)
Glucose, Bld: 84 mg/dL (ref 70–99)
Potassium: 4.4 mEq/L (ref 3.5–5.1)
Sodium: 139 mEq/L (ref 135–145)
Total Bilirubin: 0.7 mg/dL (ref 0.2–1.2)
Total Protein: 6.6 g/dL (ref 6.0–8.3)

## 2021-10-18 LAB — LIPID PANEL
Cholesterol: 205 mg/dL — ABNORMAL HIGH (ref 0–200)
HDL: 63.1 mg/dL (ref >39.00)
LDL Cholesterol: 124 mg/dL — ABNORMAL HIGH (ref 0–99)
NonHDL: 142.23
Total CHOL/HDL Ratio: 3
Triglycerides: 89 mg/dL (ref 0.0–149.0)
VLDL: 17.8 mg/dL (ref 0.0–40.0)

## 2021-10-18 LAB — TSH: TSH: 1.37 u[IU]/mL (ref 0.35–5.50)

## 2021-10-18 MED ORDER — NYSTATIN 100000 UNIT/ML MT SUSP
5.0000 mL | Freq: Four times a day (QID) | OROMUCOSAL | 0 refills | Status: DC
Start: 2021-10-18 — End: 2024-11-02

## 2021-10-18 NOTE — Patient Instructions (Signed)
Stop by the lab prior to leaving today. I will notify you of your results once received.   Send me a picture via MyChart when the mouth sore pops up again.  [facility].   You will be contacted regarding your referral to GI for the colonoscopy.  Please let us know if you have not been contacted within two weeks.   Set up a nurse visit for 2-6 months to complete the shingles series.   It was a pleasure to see you today!  Preventive Care 56-21 Years Old, Female Preventive care refers to lifestyle choices and visits with your health care provider that can promote health and wellness. This includes: A yearly physical exam. This is also called an annual wellness visit. Regular dental and eye exams. Immunizations. Screening for certain conditions. Healthy lifestyle choices, such as: Eating a healthy diet. Getting regular exercise. Not using drugs or products that contain nicotine and tobacco. Limiting alcohol use. What can I expect for my preventive care visit? Physical exam Your health care provider will check your: Height and weight. These may be used to calculate your BMI (body mass index). BMI is a measurement that tells if you are at a healthy weight. Heart rate and blood pressure. Body temperature. Skin for abnormal spots. Counseling Your health care provider may ask you questions about your: Past medical problems. Family's medical history. Alcohol, tobacco, and drug use. Emotional well-being. Home life and relationship well-being. Sexual activity. Diet, exercise, and sleep habits. Work and work Statistician. Access to firearms. Method of birth control. Menstrual cycle. Pregnancy history. What immunizations do I need? Vaccines are usually given at various ages, according to a schedule. Your health care provider will recommend vaccines for you based on your age, medical history, and lifestyle or other factors, such as travel or where  you work. What tests do I need? Blood tests Lipid and cholesterol levels. These may be checked every 5 years, or more often if you are over 17 years old. Hepatitis C test. Hepatitis B test. Screening Lung cancer screening. You may have this screening every year starting at age 7 if you have a 30-pack-year history of smoking and currently smoke or have quit within the past 15 years. Colorectal cancer screening. All adults should have this screening starting at age 3 and continuing until age 7. Your health care provider may recommend screening at age 15 if you are at increased risk. You will have tests every 1-10 years, depending on your results and the type of screening test. Diabetes screening. This is done by checking your blood sugar (glucose) after you have not eaten for a while (fasting). You may have this done every 1-3 years. Mammogram. This may be done every 1-2 years. Talk with your health care provider about when you should start having regular mammograms. This may depend on whether you have a family history of breast cancer. BRCA-related cancer screening. This may be done if you have a family history of breast, ovarian, tubal, or peritoneal cancers. Pelvic exam and Pap test. This may be done every 3 years starting at age 35. Starting at age 81, this may be done every 5 years if you have a Pap test in combination with an HPV test. Other tests STD (sexually transmitted disease) testing, if you are at risk. Bone density scan. This is done to screen for osteoporosis. You may have this scan if you are at high risk for osteoporosis. Talk with your health  care provider about your test results, treatment options, and if necessary, the need for more tests. Follow these instructions at home: Eating and drinking  Eat a diet that includes fresh fruits and vegetables, whole grains, lean protein, and low-fat dairy products. Take vitamin and mineral supplements as recommended by your  health care provider. Do not drink alcohol if: Your health care provider tells you not to drink. You are pregnant, may be pregnant, or are planning to become pregnant. If you drink alcohol: Limit how much you have to 0-1 drink a day. Be aware of how much alcohol is in your drink. In the [place] one drink equals one 12 oz bottle of beer (355 mL), one 5 oz glass of wine (148 mL), or one 1 oz glass of hard liquor (44 mL). Lifestyle Take daily care of your teeth and gums. Brush your teeth every morning and night with fluoride toothpaste. Floss one time each day. Stay active. Exercise for at least 30 minutes 5 or more days each week. Do not use any products that contain nicotine or tobacco, such as cigarettes, e-cigarettes, and chewing tobacco. If you need help quitting, ask your health care provider. Do not use drugs. If you are sexually active, practice safe sex. Use a condom or other form of protection to prevent STIs (sexually transmitted infections). If you do not wish to become pregnant, use a form of birth control. If you plan to become pregnant, see your health care provider for a prepregnancy visit. If told by your health care provider, take low-dose aspirin daily starting at age 72. Find healthy ways to cope with stress, such as: Meditation, yoga, or listening to music. Journaling. Talking to a trusted person. Spending time with friends and family. Safety Always wear your seat belt while driving or riding in a vehicle. Do not drive: If you have been drinking alcohol. Do not ride with someone who has been drinking. When you are tired or distracted. While texting. Wear a helmet and other protective equipment during sports activities. If you have firearms in your house, make sure you follow all gun safety procedures. What's next? Visit your health care provider once a year for an annual wellness visit. Ask your health care provider how often you should have your eyes and teeth  checked. Stay up to date on all vaccines. This information is not intended to replace advice given to you by your health care provider. Make sure you discuss any questions you have with your health care provider. Document Revised: 02/09/2021 Document Reviewed: 08/13/2018 Elsevier Patient Education  2022 [employer].

## 2021-10-18 NOTE — Assessment & Plan Note (Signed)
Doing well on PRN Estrace cream, continue same.  

## 2021-10-18 NOTE — Assessment & Plan Note (Signed)
Shingrix and influenza vaccine due, provided today. Colonoscopy never completed, patient agrees to proceed, referral placed to GI. Mammogram due in April 2023 as she prefers every 2 year screening. Orders placed.  Commended her on regular exercise and a healthy diet.  Exam today stable. Labs pending.

## 2021-10-18 NOTE — Assessment & Plan Note (Signed)
Well controlled on Zyrtec 10 mg, continue same.

## 2021-10-18 NOTE — Assessment & Plan Note (Signed)
Not on exam today.  Could be angular chelitis, will check for HSV in labs today. Refill provided for Nystatin as this works.   Await results.

## 2021-10-18 NOTE — Assessment & Plan Note (Signed)
She is taking levothyroxine 75 mcg correctly, continue same. Repeat TSH pending. 

## 2021-10-18 NOTE — Progress Notes (Signed)
Subjective:    Patient ID: Destiny Leon, female    DOB: 1966-09-16, 55 y.o.   MRN: [id number]  HPI  Destiny Leon is a very pleasant 55 y.o. female who presents today for complete physical and follow up of chronic conditions.  She would like a refill of Nystatin for which she uses for "cold sore" to the right corner of her mouth. Approximately 4 times annually she will develop a sore to the corner of her mouth which eventually cracks her skin. She's been using Nystatin solution PRN from a prior prescription with complete resolve. She would like a refill.   Immunizations: -Tetanus: 2018 -Influenza: Due today -Covid-19: 3 vaccines  -Shingles: Never completed  Diet: Fair diet.  Exercise: Regular online exercise, active outdoors.   Eye exam: Completes annually  Dental exam: Completes semi-annually   Pap Smear: Complete hysterectomy  Mammogram: Completed in April 2021, due April 2023 Colonoscopy: Completed Cologuard in 2019, never completed colonoscopy   BP Readings from Last 3 Encounters:  10/18/21 102/74  08/30/20 108/62  07/28/19 100/64   Wt Readings from Last 3 Encounters:  10/18/21 125 lb (56.7 kg)  08/30/20 122 lb (55.3 kg)  07/28/19 123 lb 8 oz (56 kg)       Review of Systems  Constitutional:  Negative for unexpected weight change.  HENT:  Negative for rhinorrhea.   Eyes:  Negative for visual disturbance.  Respiratory:  Negative for shortness of breath.   Cardiovascular:  Negative for chest pain.  Gastrointestinal:  Negative for constipation and diarrhea.  Genitourinary:  Negative for difficulty urinating.  Musculoskeletal:  Negative for arthralgias and myalgias.  Skin:  Negative for rash.       Sore to lip/corner of her mouth  Allergic/Immunologic: Positive for environmental allergies.  Neurological:  Negative for dizziness and headaches.  Psychiatric/Behavioral:  The patient is not nervous/anxious.         Past Medical History:  Diagnosis Date    Hypothyroidism     Social History   Socioeconomic History   Marital status: Married    Spouse name: Not on file   Number of children: Not on file   Years of education: Not on file   Highest education level: Not on file  Occupational History   Not on file  Tobacco Use   Smoking status: Never   Smokeless tobacco: Never  Substance and Sexual Activity   Alcohol use: No    Alcohol/week: 0.0 standard drinks   Drug use: No   Sexual activity: Not on file  Other Topics Concern   Not on file  Social History Narrative   Married.   1 child   Works in [employer]   Enjoys gardening, spending time with her son.    Social Determinants of Health   Financial Resource Strain: Not on file  Food Insecurity: Not on file  Transportation Needs: Not on file  Physical Activity: Not on file  Stress: Not on file  Social Connections: Not on file  Intimate Partner Violence: Not on file    Past Surgical History:  Procedure Laterality Date   ABDOMINAL HYSTERECTOMY  2007    Family History  Problem Relation Age of Onset   Heart disease Father    Hyperlipidemia Mother    Hypothyroidism Mother     No Known Allergies  Current Outpatient Medications on File Prior to Visit  Medication Sig Dispense Refill   estradiol (ESTRACE VAGINAL) 0.1 MG/GM vaginal cream Insert vaginally once to three  times weekly. 42.5 g 0   levothyroxine (SYNTHROID) 75 MCG tablet TAKE 1 TABLET EVERY MORNING ON AN EMPTY STOMACH WITH WATER ONLY, NO FOOD OR OTHER MEDICATIONS FOR 30 MINUTES. Office visit required for further refills. 30 tablet 0   No current facility-administered medications on file prior to visit.    Pulse 71   Temp 97.7 F (36.5 C) (Temporal)   Ht 5' 4.5" (1.638 m)   Wt 125 lb (56.7 kg)   SpO2 98%   BMI 21.12 kg/m  Objective:   Physical Exam HENT:     Right Ear: Tympanic membrane and ear canal normal.     Left Ear: Tympanic membrane and ear canal normal.     Nose: Nose normal.  Eyes:      Conjunctiva/sclera: Conjunctivae normal.     Pupils: Pupils are equal, round, and reactive to light.  Neck:     Thyroid: No thyromegaly.  Cardiovascular:     Rate and Rhythm: Normal rate and regular rhythm.     Heart sounds: No murmur heard. Pulmonary:     Effort: Pulmonary effort is normal.     Breath sounds: Normal breath sounds. No rales.  Abdominal:     General: Bowel sounds are normal.     Palpations: Abdomen is soft.     Tenderness: There is no abdominal tenderness.  Musculoskeletal:        General: Normal range of motion.     Cervical back: Neck supple.  Lymphadenopathy:     Cervical: No cervical adenopathy.  Skin:    General: Skin is warm and dry.     Findings: No rash.     Comments: No oral sore or evidence of angular chelitis noted on exam.  Neurological:     Mental Status: She is alert and oriented to person, place, and time.     Cranial Nerves: No cranial nerve deficit.     Deep Tendon Reflexes: Reflexes are normal and symmetric.  Psychiatric:        Mood and Affect: Mood normal.          Assessment & Plan:      This visit occurred during the SARS-CoV-2 public health emergency.  Safety protocols were in place, including screening questions prior to the visit, additional usage of staff PPE, and extensive cleaning of exam room while observing appropriate contact time as indicated for disinfecting solutions.

## 2021-10-23 LAB — HSV(HERPES SIMPLEX VRS) I + II AB-IGG
HAV 1 IGG,TYPE SPECIFIC AB: 41.5 index — ABNORMAL HIGH
HSV 2 IGG,TYPE SPECIFIC AB: <0.9 index

## 2021-10-23 LAB — HSV 1/2 AB (IGM), IFA W/RFLX TITER
HSV 1 IgM Screen: NEGATIVE
HSV 2 IgM Screen: NEGATIVE

## 2021-11-16 ENCOUNTER — Telehealth: Payer: Self-pay

## 2021-11-16 NOTE — Telephone Encounter (Signed)
Patient is ready to schedule procedure. Clinical staff will follow up with patient. °

## 2021-11-19 ENCOUNTER — Other Ambulatory Visit: Payer: Self-pay

## 2021-11-19 DIAGNOSIS — Z1211 Encounter for screening for malignant neoplasm of colon: Secondary | ICD-10-CM

## 2021-11-19 MED ORDER — NA SULFATE-K SULFATE-MG SULF 17.5-3.13-1.6 GM/177ML PO SOLN: 1.0000 | Freq: Once | ORAL | 0 refills | Status: AC

## 2021-11-19 NOTE — Telephone Encounter (Signed)
Procedure has been scheduled for 12/20/2021. 

## 2021-11-19 NOTE — Progress Notes (Signed)
Gastroenterology Pre-Procedure Review  Request Date: 12/20/2021 Requesting Physician: Dr. Allegra Lai  PATIENT REVIEW QUESTIONS: The patient responded to the following health history questions as indicated:    1. Are you having any GI issues? no 2. Do you have a personal history of Polyps? no 3. Do you have a family history of Colon Cancer or Polyps? no 4. Diabetes Mellitus? no 5. Joint replacements in the past 12 months?no 6. Major health problems in the past 3 months?no 7. Any artificial heart valves, MVP, or defibrillator?no    MEDICATIONS & ALLERGIES:    Patient reports the following regarding taking any anticoagulation/antiplatelet therapy:   Plavix, Coumadin, Eliquis, Xarelto, Lovenox, Pradaxa, Brilinta, or Effient? no Aspirin? no  Patient confirms/reports the following medications:  Current Outpatient Medications  Medication Sig Dispense Refill   estradiol (ESTRACE VAGINAL) 0.1 MG/GM vaginal cream Insert vaginally once to three times weekly. 42.5 g 0   levothyroxine (SYNTHROID) 75 MCG tablet TAKE 1 TABLET EVERY MORNING ON AN EMPTY STOMACH WITH WATER ONLY, NO FOOD OR OTHER MEDICATIONS FOR 30 MINUTES. Office visit required for further refills. 30 tablet 0   nystatin (MYCOSTATIN) 100000 UNIT/ML suspension Take 5 mLs (500,000 Units total) by mouth 4 (four) times daily. 60 mL 0   No current facility-administered medications for this visit.    Patient confirms/reports the following allergies:  No Known Allergies  No orders of the defined types were placed in this encounter.   AUTHORIZATION INFORMATION Primary Insurance: 1D#: Group #:  Secondary Insurance: 1D#: Group #:  SCHEDULE INFORMATION: Date: 12/20/2021 Time: Location: [facility]

## 2021-11-23 ENCOUNTER — Other Ambulatory Visit: Payer: Self-pay | Admitting: Primary Care

## 2021-11-23 DIAGNOSIS — E039 Hypothyroidism, unspecified: Secondary | ICD-10-CM

## 2021-12-20 ENCOUNTER — Ambulatory Visit
Admission: RE | Admit: 2021-12-20 | Discharge: 2021-12-20 | Disposition: A | Discharge status: Home / Self Care | Payer: BC Managed Care – PPO | Attending: Gastroenterology | Admitting: Gastroenterology

## 2021-12-20 ENCOUNTER — Ambulatory Visit: Payer: BC Managed Care – PPO

## 2021-12-20 ENCOUNTER — Encounter
Admission: RE | Disposition: A | Discharge status: Home / Self Care | Payer: Self-pay | Source: Home / Self Care | Attending: Gastroenterology

## 2021-12-20 ENCOUNTER — Ambulatory Visit: Payer: BC Managed Care – PPO | Admitting: Certified Registered Nurse Anesthetist

## 2021-12-20 ENCOUNTER — Encounter: Payer: Self-pay | Admitting: Gastroenterology

## 2021-12-20 DIAGNOSIS — E039 Hypothyroidism, unspecified: Secondary | ICD-10-CM | POA: Insufficient documentation

## 2021-12-20 DIAGNOSIS — Z1211 Encounter for screening for malignant neoplasm of colon: Secondary | ICD-10-CM

## 2021-12-20 HISTORY — DX: Other specified postprocedural states: Z98.890

## 2021-12-20 HISTORY — DX: Nausea with vomiting, unspecified: R11.2

## 2021-12-20 HISTORY — PX: COLONOSCOPY WITH PROPOFOL: SHX5780

## 2021-12-20 SURGERY — COLONOSCOPY WITH PROPOFOL
Anesthesia: General

## 2021-12-20 MED ORDER — PROPOFOL 10 MG/ML IV BOLUS
INTRAVENOUS | Status: DC | PRN
  Administered 2021-12-20: 60 mg via INTRAVENOUS

## 2021-12-20 MED ORDER — SODIUM CHLORIDE 0.9 % IV SOLN: INTRAVENOUS | Status: DC

## 2021-12-20 MED ORDER — PROPOFOL 500 MG/50ML IV EMUL
INTRAVENOUS | Status: DC | PRN
  Administered 2021-12-20: 180 ug/kg/min via INTRAVENOUS

## 2021-12-20 MED ORDER — LIDOCAINE HCL (CARDIAC) PF 100 MG/5ML IV SOSY
PREFILLED_SYRINGE | INTRAVENOUS | Status: DC | PRN
Start: 2021-12-20 — End: 2021-12-20
  Administered 2021-12-20: 30 mg via INTRAVENOUS

## 2021-12-20 NOTE — Op Note (Signed)
[facility] Patient Name: Destiny Leon Procedure Date: 12/20/2021 10:17 AM MRN: [id number] Account #: [id number] Date of Birth: Jun 14, 1966 Admit Type: Outpatient Age: 56 Room: [facility] ENDO ROOM 4 Gender: Female Note Status: Finalized Instrument Name: Peds Colonoscope [id number] Procedure:             Colonoscopy Indications:           Screening for colorectal malignant neoplasm, This is                         the patient's first colonoscopy Providers:             Lin Landsman MD, MD Referring MD:          Pleas Koch (Referring MD) Medicines:             See the Anesthesia note for documentation of the                         administered medications, General Anesthesia Complications:         No immediate complications. Estimated blood loss: None. Procedure:             Pre-Anesthesia Assessment:                        - Prior to the procedure, a History and Physical was                         performed, and patient medications and allergies were                         reviewed. The patient is competent. The risks and                         benefits of the procedure and the sedation options and                         risks were discussed with the patient. All questions                         were answered and informed consent was obtained.                         Patient identification and proposed procedure were                         verified by the physician, the nurse, the                         anesthesiologist, the anesthetist and the technician                         in the pre-procedure area in the procedure room in the                         endoscopy suite. Mental Status Examination: alert and                         oriented. Airway Examination: normal oropharyngeal  airway and neck mobility. Respiratory Examination:                         clear to auscultation. CV Examination: normal.                          Prophylactic Antibiotics: The patient does not require                         prophylactic antibiotics. Prior Anticoagulants: The                         patient has taken no previous anticoagulant or                         antiplatelet agents. ASA Grade Assessment: II - A                         patient with mild systemic disease. After reviewing                         the risks and benefits, the patient was deemed in                         satisfactory condition to undergo the procedure. The                         anesthesia plan was to use general anesthesia.                         Immediately prior to administration of medications,                         the patient was re-assessed for adequacy to receive                         sedatives. The heart rate, respiratory rate, oxygen                         saturations, blood pressure, adequacy of pulmonary                         ventilation, and response to care were monitored                         throughout the procedure. The physical status of the                         patient was re-assessed after the procedure.                        After obtaining informed consent, the colonoscope was                         passed under direct vision. Throughout the procedure,                         the patient's blood pressure, pulse, and oxygen  saturations were monitored continuously. The                         Colonoscope was introduced through the anus and                         advanced to the the cecum, identified by appendiceal                         orifice and ileocecal valve. The colonoscopy was                         performed without difficulty. The patient tolerated                         the procedure well. The quality of the bowel                         preparation was evaluated using the BBPS [facility] Bowel                         Preparation Scale) with scores of: Right Colon = 3,                          Transverse Colon = 3 and Left Colon = 3 (entire mucosa                         seen well with no residual staining, small fragments                         of stool or opaque liquid). The total BBPS score                         equals 9. Findings:      The perianal and digital rectal examinations were normal. Pertinent       negatives include normal sphincter tone and no palpable rectal lesions.      The entire examined colon appeared normal.      The retroflexed view of the distal rectum and anal verge was normal and       showed no anal or rectal abnormalities. Impression:            - The entire examined colon is normal.                        - The distal rectum and anal verge are normal on                         retroflexion view.                        - No specimens collected. Recommendation:        - Discharge patient to home (with escort).                        - Resume previous diet today.                        - Continue present medications.                        -  Repeat colonoscopy in 10 years for screening                         purposes. Procedure Code(s):     --- Professional ---                        [id number], Colorectal cancer screening; colonoscopy on                         individual not meeting criteria for high risk Diagnosis Code(s):     --- Professional ---                        Z12.11, Encounter for screening for malignant neoplasm                         of colon CPT copyright 2019 American Medical Association. All rights reserved. The codes documented in this report are preliminary and upon coder review may  be revised to meet current compliance requirements. Dr. Ulyess Mort Lin Landsman MD, MD 12/20/2021 10:41:31 AM This report has been signed electronically. Number of Addenda: 0 Note Initiated On: 12/20/2021 10:17 AM Scope Withdrawal Time: 0 hours 6 minutes 1 second  Total Procedure Duration: 0 hours 9 minutes 10  seconds  Estimated Blood Loss:  Estimated blood loss: none.      [facility]

## 2021-12-20 NOTE — Transfer of Care (Signed)
Immediate Anesthesia Transfer of Care Note  Patient: Destiny Leon  Procedure(s) Performed: COLONOSCOPY WITH PROPOFOL  Patient Location: Endoscopy Unit  Anesthesia Type:General  Level of Consciousness: drowsy  Airway & Oxygen Therapy: Patient Spontanous Breathing  Post-op Assessment: Report given to RN and Post -op Vital signs reviewed and stable  Post vital signs: Reviewed and stable  Last Vitals:  Vitals Value Taken Time  BP 89/56 12/20/21 1044  Temp 36.2 C 12/20/21 1044  Pulse 69 12/20/21 1044  Resp 16 12/20/21 1044  SpO2 97 % 12/20/21 1044  Vitals shown include unvalidated device data.  Last Pain:  Vitals:   12/20/21 1044  TempSrc: Temporal  PainSc: Asleep         Complications: No notable events documented.

## 2021-12-20 NOTE — H&P (Signed)
°  Cephas Darby, MD [address]  [place]  [place], [place] [address]  Main: [phone]  Fax: [phone] Pager: [phone]  Primary Care Physician:  Pleas Koch, NP Primary Gastroenterologist:  Dr. Cephas Darby  Pre-Procedure History & Physical: HPI:  Destiny Leon is a 56 y.o. female is here for an colonoscopy.   Past Medical History:  Diagnosis Date   Hypothyroidism    PONV (postoperative nausea and vomiting)     Past Surgical History:  Procedure Laterality Date   ABDOMINAL HYSTERECTOMY  2007    Prior to Admission medications   Medication Sig Start Date End Date Taking? Authorizing Provider  levothyroxine (SYNTHROID) 75 MCG tablet Take 1 tablet by mouth every morning on an empty stomach with water only.  No food or other medications for 30 minutes. 11/23/21   Pleas Koch, NP  estradiol (ESTRACE VAGINAL) 0.1 MG/GM vaginal cream Insert vaginally once to three times weekly. 08/30/20   Pleas Koch, NP  nystatin (MYCOSTATIN) 100000 UNIT/ML suspension Take 5 mLs (500,000 Units total) by mouth 4 (four) times daily. 10/18/21   Pleas Koch, NP    Allergies as of 11/19/2021   (No Known Allergies)    Family History  Problem Relation Age of Onset   Heart disease Father    Hyperlipidemia Mother    Hypothyroidism Mother     Social History   Socioeconomic History   Marital status: Married    Spouse name: Not on file   Number of children: Not on file   Years of education: Not on file   Highest education level: Not on file  Occupational History   Not on file  Tobacco Use   Smoking status: Never   Smokeless tobacco: Never  Substance and Sexual Activity   Alcohol use: No    Alcohol/week: 0.0 standard drinks   Drug use: No   Sexual activity: Not on file  Other Topics Concern   Not on file  Social History Narrative   Married.   1 child   Works in [employer]   Enjoys gardening, spending time with her son.    Social  Determinants of Health   Financial Resource Strain: Not on file  Food Insecurity: Not on file  Transportation Needs: Not on file  Physical Activity: Not on file  Stress: Not on file  Social Connections: Not on file  Intimate Partner Violence: Not on file    Review of Systems: See HPI, otherwise negative ROS  Physical Exam: BP 119/73    Pulse 66    Temp (!) 96.2 F (35.7 C) (Temporal)    Resp 19    Ht 5\' 4"  (1.626 m)    Wt 56.7 kg    SpO2 100%    BMI 21.46 kg/m  General:   Alert,  pleasant and cooperative in NAD Head:  Normocephalic and atraumatic. Neck:  Supple; no masses or thyromegaly. Lungs:  Clear throughout to auscultation.    Heart:  Regular rate and rhythm. Abdomen:  Soft, nontender and nondistended. Normal bowel sounds, without guarding, and without rebound.   Neurologic:  Alert and  oriented x4;  grossly normal neurologically.  Impression/Plan: Destiny Leon is here for an colonoscopy to be performed for colon cancer screening  Risks, benefits, limitations, and alternatives regarding  colonoscopy have been reviewed with the patient.  Questions have been answered.  All parties agreeable.   Sherri Sear, MD  12/20/2021, 9:46 AM

## 2021-12-20 NOTE — Anesthesia Preprocedure Evaluation (Signed)
Anesthesia Evaluation  Patient identified by MRN, date of birth, ID band Patient awake    Reviewed: Allergy & Precautions, NPO status , Patient's Chart, lab work & pertinent test results  History of Anesthesia Complications (+) PONV and history of anesthetic complications  Airway Mallampati: III  TM Distance: >3 FB Neck ROM: full    Dental  (+) Chipped   Pulmonary neg shortness of breath,    Pulmonary exam normal        Cardiovascular Exercise Tolerance: Good (-) angina(-) Past MI negative cardio ROS Normal cardiovascular exam     Neuro/Psych negative neurological ROS  negative psych ROS   GI/Hepatic negative GI ROS, Neg liver ROS, neg GERD  ,  Endo/Other  Hypothyroidism   Renal/GU negative Renal ROS  negative genitourinary   Musculoskeletal   Abdominal   Peds  Hematology negative hematology ROS (+)   Anesthesia Other Findings Past Medical History: No date: Hypothyroidism No date: PONV (postoperative nausea and vomiting)  Past Surgical History: 2007: ABDOMINAL HYSTERECTOMY  BMI    Body Mass Index: 21.46 kg/m      Reproductive/Obstetrics negative OB ROS                             Anesthesia Physical Anesthesia Plan  ASA: 2  Anesthesia Plan: General   Post-op Pain Management:    Induction: Intravenous  PONV Risk Score and Plan: Propofol infusion and TIVA  Airway Management Planned: Natural Airway and Nasal Cannula  Additional Equipment:   Intra-op Plan:   Post-operative Plan:   Informed Consent: I have reviewed the patients History and Physical, chart, labs and discussed the procedure including the risks, benefits and alternatives for the proposed anesthesia with the patient or authorized representative who has indicated his/her understanding and acceptance.     Dental Advisory Given  Plan Discussed with: Anesthesiologist, CRNA and Surgeon  Anesthesia Plan  Comments: (Patient consented for risks of anesthesia including but not limited to:  - adverse reactions to medications - risk of airway placement if required - damage to eyes, teeth, lips or other oral mucosa - nerve damage due to positioning  - sore throat or hoarseness - Damage to heart, brain, nerves, lungs, other parts of body or loss of life  Patient voiced understanding.)        Anesthesia Quick Evaluation

## 2021-12-20 NOTE — Anesthesia Postprocedure Evaluation (Signed)
Anesthesia Post Note  Patient: Destiny Leon  Procedure(s) Performed: COLONOSCOPY WITH PROPOFOL  Patient location during evaluation: Endoscopy Anesthesia Type: General Level of consciousness: awake and alert Pain management: pain level controlled Vital Signs Assessment: post-procedure vital signs reviewed and stable Respiratory status: spontaneous breathing, nonlabored ventilation, respiratory function stable and patient connected to nasal cannula oxygen Cardiovascular status: blood pressure returned to baseline and stable Postop Assessment: no apparent nausea or vomiting Anesthetic complications: no   No notable events documented.   Last Vitals:  Vitals:   12/20/21 1054 12/20/21 1103  BP: 102/70 112/85  Pulse: 75 63  Resp: 18 11  Temp:    SpO2: 99% 100%    Last Pain:  Vitals:   12/20/21 1103  TempSrc:   PainSc: 0-No pain                 Cleda Mccreedy Destiny Leon

## 2021-12-21 ENCOUNTER — Encounter: Payer: Self-pay | Admitting: Gastroenterology

## 2021-12-28 ENCOUNTER — Other Ambulatory Visit: Payer: Self-pay | Admitting: Primary Care

## 2021-12-28 ENCOUNTER — Other Ambulatory Visit: Payer: Self-pay

## 2021-12-28 ENCOUNTER — Ambulatory Visit
Admission: RE | Admit: 2021-12-28 | Discharge: 2021-12-28 | Disposition: A | Discharge status: Home / Self Care | Payer: BC Managed Care – PPO | Source: Ambulatory Visit | Attending: Primary Care | Admitting: Primary Care

## 2021-12-28 DIAGNOSIS — Z1231 Encounter for screening mammogram for malignant neoplasm of breast: Secondary | ICD-10-CM

## 2022-02-07 ENCOUNTER — Other Ambulatory Visit: Payer: Self-pay | Admitting: Primary Care

## 2022-02-07 ENCOUNTER — Ambulatory Visit: Payer: BC Managed Care – PPO | Admitting: Primary Care

## 2022-02-07 DIAGNOSIS — N952 Postmenopausal atrophic vaginitis: Secondary | ICD-10-CM

## 2022-02-07 MED ORDER — ESTRADIOL 0.1 MG/GM VA CREA: TOPICAL_CREAM | VAGINAL | 0 refills | Status: DC

## 2022-02-14 ENCOUNTER — Telehealth: Payer: Self-pay

## 2022-02-14 DIAGNOSIS — N952 Postmenopausal atrophic vaginitis: Secondary | ICD-10-CM

## 2022-02-14 NOTE — Telephone Encounter (Signed)
Received call from pharmacy needing clarification on the amount in grams of cream to use each time. This script will be cancelled due to inability to provide information and will need new script sent in.  ?

## 2022-02-17 MED ORDER — ESTRADIOL 0.1 MG/GM VA CREA: 1.0000 | TOPICAL_CREAM | VAGINAL | 0 refills | Status: DC

## 2022-02-17 NOTE — Addendum Note (Signed)
Addended by: Doreene Nest on: 02/17/2022 07:30 PM ? ? Modules accepted: Orders ? ?

## 2022-02-17 NOTE — Telephone Encounter (Signed)
Noted, new Rx sent to pharmacy. 

## 2022-02-27 ENCOUNTER — Ambulatory Visit: Payer: BC Managed Care – PPO

## 2022-02-28 ENCOUNTER — Other Ambulatory Visit: Payer: Self-pay

## 2022-02-28 ENCOUNTER — Ambulatory Visit (INDEPENDENT_AMBULATORY_CARE_PROVIDER_SITE_OTHER): Payer: BC Managed Care – PPO

## 2022-02-28 DIAGNOSIS — Z23 Encounter for immunization: Secondary | ICD-10-CM

## 2022-02-28 NOTE — Progress Notes (Signed)
Per orders of Mayra Reel, AGNP-C, injection of Shingles #2 given by Donnamarie Poag in left deltoid. ?Patient tolerated injection well.Copy of updated VIS given to patient and updated information in [facility].  ?

## 2022-10-18 ENCOUNTER — Other Ambulatory Visit: Payer: Self-pay | Admitting: Primary Care

## 2022-10-18 DIAGNOSIS — E039 Hypothyroidism, unspecified: Secondary | ICD-10-CM

## 2022-10-21 ENCOUNTER — Encounter: Payer: Self-pay | Admitting: Primary Care

## 2022-10-21 ENCOUNTER — Ambulatory Visit (INDEPENDENT_AMBULATORY_CARE_PROVIDER_SITE_OTHER): Payer: BC Managed Care – PPO | Admitting: Primary Care

## 2022-10-21 VITALS — BP 106/70 | HR 89 | Temp 97.9°F | Ht 64.75 in | Wt 127.0 lb

## 2022-10-21 DIAGNOSIS — E785 Hyperlipidemia, unspecified: Secondary | ICD-10-CM

## 2022-10-21 DIAGNOSIS — M791 Myalgia, unspecified site: Secondary | ICD-10-CM | POA: Diagnosis not present

## 2022-10-21 DIAGNOSIS — E039 Hypothyroidism, unspecified: Secondary | ICD-10-CM | POA: Diagnosis not present

## 2022-10-21 DIAGNOSIS — Z0001 Encounter for general adult medical examination with abnormal findings: Secondary | ICD-10-CM | POA: Diagnosis not present

## 2022-10-21 DIAGNOSIS — R82998 Other abnormal findings in urine: Secondary | ICD-10-CM | POA: Diagnosis not present

## 2022-10-21 DIAGNOSIS — K130 Diseases of lips: Secondary | ICD-10-CM | POA: Diagnosis not present

## 2022-10-21 DIAGNOSIS — N952 Postmenopausal atrophic vaginitis: Secondary | ICD-10-CM | POA: Diagnosis not present

## 2022-10-21 DIAGNOSIS — R631 Polydipsia: Secondary | ICD-10-CM | POA: Diagnosis not present

## 2022-10-21 HISTORY — DX: Other abnormal findings in urine: R82.998

## 2022-10-21 LAB — POC URINALSYSI DIPSTICK (AUTOMATED)
Bilirubin, UA: NEGATIVE
Blood, UA: POSITIVE
Glucose, UA: POSITIVE — AB
Ketones, UA: NEGATIVE
Leukocytes, UA: NEGATIVE
Nitrite, UA: POSITIVE
Protein, UA: POSITIVE — AB
Spec Grav, UA: 1.015 (ref 1.010–1.025)
Urobilinogen, UA: 1 E.U./dL
pH, UA: 6 (ref 5.0–8.0)

## 2022-10-21 NOTE — Assessment & Plan Note (Addendum)
Noted today in office. Suspicious for rhabdomyolysis.   UA with 1+ blood, trace protein, trace glucose.  Urine microscopic, urine culture, and myoglobin urine pending. Other labs pending including CK, CMP, A1c.

## 2022-10-21 NOTE — Assessment & Plan Note (Signed)
Immunizations UTD. Influenza vaccine provided today. Mammogram UTD. Colonoscopy UTD, due 2033.  Discussed the importance of a healthy diet and regular exercise in order for weight loss, and to reduce the risk of further co-morbidity.  Exam as noted. Labs pending.  Follow up in 1 year for repeat physical.'

## 2022-10-21 NOTE — Assessment & Plan Note (Signed)
Commended her on regular exercise and a healthy diet.  Repeat lipid panel pending.

## 2022-10-21 NOTE — Progress Notes (Signed)
Subjective:    Patient ID: Destiny Leon, female    DOB: 05-04-1966, 56 y.o.   MRN: [id number]  HPI  Destiny Leon is a very pleasant 56 y.o. female who presents today for complete physical and follow up of chronic conditions.  She would also like to discuss multiple symptoms.   Symptom onset about 10 days ago with constant, bilateral ankle swelling; calf tightness; dark colored urine; muscle aches to the elbows an lower extremities; dry mouth; chills followed by sweats.  History of ankle swelling 2 years ago, swelling resolved sporadically. She donates plasma 1-2 times monthly, last donation was 10/23 and 10/25. She has increased her exercise and is exercising daily for 30-70 minutes. She is doing a mix of cardio and weights, denies strenuous activity, but sometimes notices muscle aches when performing some exercises.   Nothing quenches her thirst including water, ginger ale, sweet tea. Dark urine throughout the day. She denies foul smell, bleeding, dysuria, low back pain.   Upon waking in the morning she feels energized, but towards 5 pm she begins to feel chills and feeling cold. She checks her temperature regularly and does not have fevers. She will take Aleve and Advil nightly, then at night feels flushed, and starts sweating from her anterior neck through her abdomen.   She's also noticed increased irritability.   Over the last few months she's noticed making more mistakes and is more forgetful. She denies forgetting how to do daily tasks or how to get home. She denies increased stress or anxiety.   Partial hysterectomy in her early 30's.   Immunizations: -Tetanus: 2018 -Influenza: Due today -Shingles: Completed Shingrix  Diet: Healthy diet.  Exercise: Exercising regularly   Eye exam: Completes annually  Dental exam: Completes semi-annually   Pap Smear: Hysterectomy  Mammogram: Completed in January 2023  Colonoscopy: Completed in 2023, due 2033  BP Readings from Last  3 Encounters:  10/21/22 106/70  12/20/21 112/85  10/18/21 102/74   Wt Readings from Last 3 Encounters:  10/21/22 127 lb (57.6 kg)  12/20/21 125 lb (56.7 kg)  10/18/21 125 lb (56.7 kg)         Review of Systems  Constitutional:  Positive for chills. Negative for unexpected weight change.  HENT:  Negative for rhinorrhea.   Respiratory:  Negative for cough and shortness of breath.   Cardiovascular:  Positive for leg swelling. Negative for chest pain.  Gastrointestinal:  Negative for constipation and diarrhea.  Endocrine: Positive for polydipsia.  Genitourinary:  Negative for difficulty urinating, dysuria and vaginal discharge.       Dark urine  Musculoskeletal:  Positive for arthralgias and myalgias.  Skin:  Negative for rash.  Allergic/Immunologic: Negative for environmental allergies.  Neurological:  Negative for dizziness and headaches.  Psychiatric/Behavioral:  The patient is not nervous/anxious.        Increased irritability          Past Medical History:  Diagnosis Date   Hypothyroidism    PONV (postoperative nausea and vomiting)     Social History   Socioeconomic History   Marital status: Married    Spouse name: Not on file   Number of children: Not on file   Years of education: Not on file   Highest education level: Not on file  Occupational History   Not on file  Tobacco Use   Smoking status: Never   Smokeless tobacco: Never  Substance and Sexual Activity   Alcohol use: No  Alcohol/week: 0.0 standard drinks of alcohol   Drug use: No   Sexual activity: Not on file  Other Topics Concern   Not on file  Social History Narrative   Married.   1 child   Works in [employer]   Enjoys gardening, spending time with her son.    Social Determinants of Health   Financial Resource Strain: Not on file  Food Insecurity: Not on file  Transportation Needs: Not on file  Physical Activity: Not on file  Stress: Not on file  Social Connections: Not on file   Intimate Partner Violence: Not on file    Past Surgical History:  Procedure Laterality Date   ABDOMINAL HYSTERECTOMY  2007   COLONOSCOPY WITH PROPOFOL N/A 12/20/2021   Procedure: COLONOSCOPY WITH PROPOFOL;  Surgeon: Toney Reil, MD;  Location: [facility] ENDOSCOPY;  Service: Gastroenterology;  Laterality: N/A;    Family History  Problem Relation Age of Onset   Heart disease Father    Hyperlipidemia Mother    Hypothyroidism Mother     No Known Allergies  Current Outpatient Medications on File Prior to Visit  Medication Sig Dispense Refill   estradiol (ESTRACE VAGINAL) 0.1 MG/GM vaginal cream Place 1 Applicatorful vaginally 3 (three) times a week. 42.5 g 0   levothyroxine (SYNTHROID) 75 MCG tablet Take 1 tablet by mouth every morning on an empty stomach with water only.  No food or other medications for 30 minutes. 90 tablet 3   nystatin (MYCOSTATIN) 100000 UNIT/ML suspension Take 5 mLs (500,000 Units total) by mouth 4 (four) times daily. 60 mL 0   No current facility-administered medications on file prior to visit.    BP 106/70   Pulse 89   Temp 97.9 F (36.6 C) (Temporal)   Ht 5' 4.75" (1.645 m)   Wt 127 lb (57.6 kg)   SpO2 98%   BMI 21.30 kg/m  Objective:   Physical Exam HENT:     Right Ear: Tympanic membrane and ear canal normal.     Left Ear: Tympanic membrane and ear canal normal.     Nose: Nose normal.  Eyes:     Conjunctiva/sclera: Conjunctivae normal.     Pupils: Pupils are equal, round, and reactive to light.  Neck:     Thyroid: No thyromegaly.  Cardiovascular:     Rate and Rhythm: Normal rate and regular rhythm.     Heart sounds: No murmur heard.    Comments: Moderate bilateral ankle edema to bilateral ankles.  Pulmonary:     Effort: Pulmonary effort is normal.     Breath sounds: Normal breath sounds. No rales.  Abdominal:     General: Bowel sounds are normal.     Palpations: Abdomen is soft.     Tenderness: There is no abdominal tenderness.   Musculoskeletal:        General: Normal range of motion.     Cervical back: Neck supple.  Lymphadenopathy:     Cervical: No cervical adenopathy.  Skin:    General: Skin is warm and dry.     Findings: No rash.  Neurological:     Mental Status: She is alert and oriented to person, place, and time.     Cranial Nerves: No cranial nerve deficit.     Deep Tendon Reflexes: Reflexes are normal and symmetric.  Psychiatric:        Mood and Affect: Mood normal.           Assessment & Plan:   Problem List  Items Addressed This Visit       Digestive   Sore of lip    Intermittent.  Continue Nystatin suspension PRN.        Endocrine   Hypothyroidism    She is taking levothyroxine correctly.  Continue levothyroxine 75 mcg daily. Repeat TSH pending.      Relevant Orders   TSH     Genitourinary   Vaginal atrophy    Controlled.  Continue Estrace cream 0.1 mg/gm once weekly.        Other   Encounter for annual general medical examination with abnormal findings in adult - Primary    Immunizations UTD. Influenza vaccine provided today. Mammogram UTD. Colonoscopy UTD, due 2033.  Discussed the importance of a healthy diet and regular exercise in order for weight loss, and to reduce the risk of further co-morbidity.  Exam as noted. Labs pending.  Follow up in 1 year for repeat physical.'      Dark urine    Noted today in office. Suspicious for rhabdomyolysis.   UA with 1+ blood, trace protein, trace glucose.  Urine microscopic, urine culture, and myoglobin urine pending. Other labs pending including CK, CMP, A1c.       Relevant Orders   POCT Urinalysis Dipstick (Automated) (Completed)   Comprehensive metabolic panel   CBC with Differential/Platelet   Urine Microscopic   CK   Myoglobin, urine   Urine Culture   Myalgia    Suspicious for rhabdomyolysis, especially given increased regular exercise.  Checking labs today including CK, CMP, CBC, urinalysis,  myoglobin urine, A1c.  Continued regular hydration with water, add Gatorade. Strict hospital precautions provided.  Fortunately, she appears stable for outpatient treatment. Await results.      Relevant Orders   Comprehensive metabolic panel   CBC with Differential/Platelet   CK   Myoglobin, urine   Hyperlipidemia    Commended her on regular exercise and a healthy diet.  Repeat lipid panel pending.       Relevant Orders   Lipid panel   Other Visit Diagnoses     Polydipsia       Relevant Orders   Hemoglobin A1c          Doreene Nest, NP

## 2022-10-21 NOTE — Assessment & Plan Note (Signed)
She is taking levothyroxine correctly.  Continue levothyroxine 75 mcg daily. Repeat TSH pending. 

## 2022-10-21 NOTE — Assessment & Plan Note (Signed)
Intermittent.  Continue Nystatin suspension PRN.

## 2022-10-21 NOTE — Patient Instructions (Addendum)
Stop by the lab prior to leaving today. I will notify you of your results once received.   It was a pleasure to see you today!  Preventive Care 78-57 Years Old, Female Preventive care refers to lifestyle choices and visits with your health care provider that can promote health and wellness. Preventive care visits are also called wellness exams. What can I expect for my preventive care visit? Counseling Your health care provider may ask you questions about your: Medical history, including: Past medical problems. Family medical history. Pregnancy history. Current health, including: Menstrual cycle. Method of birth control. Emotional well-being. Home life and relationship well-being. Sexual activity and sexual health. Lifestyle, including: Alcohol, nicotine or tobacco, and drug use. Access to firearms. Diet, exercise, and sleep habits. Work and work Statistician. Sunscreen use. Safety issues such as seatbelt and bike helmet use. Physical exam Your health care provider will check your: Height and weight. These may be used to calculate your BMI (body mass index). BMI is a measurement that tells if you are at a healthy weight. Waist circumference. This measures the distance around your waistline. This measurement also tells if you are at a healthy weight and may help predict your risk of certain diseases, such as type 2 diabetes and high blood pressure. Heart rate and blood pressure. Body temperature. Skin for abnormal spots. What immunizations do I need?  Vaccines are usually given at various ages, according to a schedule. Your health care provider will recommend vaccines for you based on your age, medical history, and lifestyle or other factors, such as travel or where you work. What tests do I need? Screening Your health care provider may recommend screening tests for certain conditions. This may include: Lipid and cholesterol levels. Diabetes screening. This is done by checking  your blood sugar (glucose) after you have not eaten for a while (fasting). Pelvic exam and Pap test. Hepatitis B test. Hepatitis C test. HIV (human immunodeficiency virus) test. STI (sexually transmitted infection) testing, if you are at risk. Lung cancer screening. Colorectal cancer screening. Mammogram. Talk with your health care provider about when you should start having regular mammograms. This may depend on whether you have a family history of breast cancer. BRCA-related cancer screening. This may be done if you have a family history of breast, ovarian, tubal, or peritoneal cancers. Bone density scan. This is done to screen for osteoporosis. Talk with your health care provider about your test results, treatment options, and if necessary, the need for more tests. Follow these instructions at home: Eating and drinking  Eat a diet that includes fresh fruits and vegetables, whole grains, lean protein, and low-fat dairy products. Take vitamin and mineral supplements as recommended by your health care provider. Do not drink alcohol if: Your health care provider tells you not to drink. You are pregnant, may be pregnant, or are planning to become pregnant. If you drink alcohol: Limit how much you have to 0-1 drink a day. Know how much alcohol is in your drink. In the [place] one drink equals one 12 oz bottle of beer (355 mL), one 5 oz glass of wine (148 mL), or one 1 oz glass of hard liquor (44 mL). Lifestyle Brush your teeth every morning and night with fluoride toothpaste. Floss one time each day. Exercise for at least 30 minutes 5 or more days each week. Do not use any products that contain nicotine or tobacco. These products include cigarettes, chewing tobacco, and vaping devices, such as e-cigarettes. If you need  help quitting, ask your health care provider. Do not use drugs. If you are sexually active, practice safe sex. Use a condom or other form of protection to prevent STIs. If you  do not wish to become pregnant, use a form of birth control. If you plan to become pregnant, see your health care provider for a prepregnancy visit. Take aspirin only as told by your health care provider. Make sure that you understand how much to take and what form to take. Work with your health care provider to find out whether it is safe and beneficial for you to take aspirin daily. Find healthy ways to manage stress, such as: Meditation, yoga, or listening to music. Journaling. Talking to a trusted person. Spending time with friends and family. Minimize exposure to UV radiation to reduce your risk of skin cancer. Safety Always wear your seat belt while driving or riding in a vehicle. Do not drive: If you have been drinking alcohol. Do not ride with someone who has been drinking. When you are tired or distracted. While texting. If you have been using any mind-altering substances or drugs. Wear a helmet and other protective equipment during sports activities. If you have firearms in your house, make sure you follow all gun safety procedures. Seek help if you have been physically or sexually abused. What's next? Visit your health care provider once a year for an annual wellness visit. Ask your health care provider how often you should have your eyes and teeth checked. Stay up to date on all vaccines. This information is not intended to replace advice given to you by your health care provider. Make sure you discuss any questions you have with your health care provider. Document Revised: 05/30/2021 Document Reviewed: 05/30/2021 Elsevier Patient Education  [place].

## 2022-10-21 NOTE — Assessment & Plan Note (Signed)
Suspicious for rhabdomyolysis, especially given increased regular exercise.  Checking labs today including CK, CMP, CBC, urinalysis, myoglobin urine, A1c.  Continued regular hydration with water, add Gatorade. Strict hospital precautions provided.  Fortunately, she appears stable for outpatient treatment. Await results.

## 2022-10-21 NOTE — Assessment & Plan Note (Signed)
Controlled.  Continue Estrace cream 0.1 mg/gm once weekly.

## 2022-10-22 ENCOUNTER — Other Ambulatory Visit: Payer: Self-pay | Admitting: Primary Care

## 2022-10-22 ENCOUNTER — Other Ambulatory Visit: Payer: BC Managed Care – PPO

## 2022-10-22 DIAGNOSIS — R7989 Other specified abnormal findings of blood chemistry: Secondary | ICD-10-CM

## 2022-10-22 DIAGNOSIS — Z0001 Encounter for general adult medical examination with abnormal findings: Secondary | ICD-10-CM

## 2022-10-22 DIAGNOSIS — R718 Other abnormality of red blood cells: Secondary | ICD-10-CM | POA: Diagnosis not present

## 2022-10-22 DIAGNOSIS — D72820 Lymphocytosis (symptomatic): Secondary | ICD-10-CM | POA: Diagnosis not present

## 2022-10-22 LAB — COMPREHENSIVE METABOLIC PANEL
ALT: 380 U/L — ABNORMAL HIGH (ref 0–35)
AST: 404 U/L — ABNORMAL HIGH (ref 0–37)
Albumin: 3.1 g/dL — ABNORMAL LOW (ref 3.5–5.2)
Alkaline Phosphatase: 193 U/L — ABNORMAL HIGH (ref 39–117)
BUN: 13 mg/dL (ref 6–23)
CO2: 28 mEq/L (ref 19–32)
Calcium: 8.1 mg/dL — ABNORMAL LOW (ref 8.4–10.5)
Chloride: 105 mEq/L (ref 96–112)
Creatinine, Ser: 0.98 mg/dL (ref 0.40–1.20)
GFR: 64.15 mL/min (ref >60.00)
Glucose, Bld: 96 mg/dL (ref 70–99)
Potassium: 4.4 mEq/L (ref 3.5–5.1)
Sodium: 139 mEq/L (ref 135–145)
Total Bilirubin: 0.6 mg/dL (ref 0.2–1.2)
Total Protein: 5.5 g/dL — ABNORMAL LOW (ref 6.0–8.3)

## 2022-10-22 LAB — CK: Total CK: 95 U/L (ref 7–177)

## 2022-10-22 LAB — CBC WITH DIFFERENTIAL/PLATELET
Basophils Absolute: 0.1 10*3/uL (ref 0.0–0.1)
Basophils Relative: 0.8 % (ref 0.0–3.0)
Eosinophils Absolute: 0.4 10*3/uL (ref 0.0–0.7)
Eosinophils Relative: 3 % (ref 0.0–5.0)
HCT: 34.8 % — ABNORMAL LOW (ref 36.0–46.0)
Hemoglobin: 11.6 g/dL — ABNORMAL LOW (ref 12.0–15.0)
Lymphocytes Relative: 75 % — ABNORMAL HIGH (ref 12.0–46.0)
Lymphs Abs: 9.9 10*3/uL — ABNORMAL HIGH (ref 0.7–4.0)
MCHC: 33.2 g/dL (ref 30.0–36.0)
MCV: 90.5 fl (ref 78.0–100.0)
Monocytes Absolute: 0.8 10*3/uL (ref 0.1–1.0)
Monocytes Relative: 6 % (ref 3.0–12.0)
Neutro Abs: 2 10*3/uL (ref 1.4–7.7)
Neutrophils Relative %: 15.2 % — ABNORMAL LOW (ref 43.0–77.0)
Platelets: 217 10*3/uL (ref 150.0–400.0)
RBC: 3.85 Mil/uL — ABNORMAL LOW (ref 3.87–5.11)
RDW: 14.2 % (ref 11.5–15.5)
WBC: 13.2 10*3/uL — ABNORMAL HIGH (ref 4.0–10.5)

## 2022-10-22 LAB — LIPID PANEL
Cholesterol: 150 mg/dL (ref 0–200)
HDL: 26.7 mg/dL — ABNORMAL LOW (ref >39.00)
LDL Cholesterol: 83 mg/dL (ref 0–99)
NonHDL: 122.84
Total CHOL/HDL Ratio: 6
Triglycerides: 197 mg/dL — ABNORMAL HIGH (ref 0.0–149.0)
VLDL: 39.4 mg/dL (ref 0.0–40.0)

## 2022-10-22 LAB — URINALYSIS, MICROSCOPIC ONLY

## 2022-10-22 LAB — URINE CULTURE
MICRO NUMBER:: 14148875
Result:: NO GROWTH
SPECIMEN QUALITY:: ADEQUATE

## 2022-10-22 LAB — HEMOGLOBIN A1C: Hgb A1c MFr Bld: 5.6 % (ref 4.6–6.5)

## 2022-10-22 LAB — TSH: TSH: 4.69 u[IU]/mL (ref 0.35–5.50)

## 2022-10-23 ENCOUNTER — Ambulatory Visit: Payer: BC Managed Care – PPO

## 2022-10-23 ENCOUNTER — Other Ambulatory Visit (INDEPENDENT_AMBULATORY_CARE_PROVIDER_SITE_OTHER): Payer: BC Managed Care – PPO

## 2022-10-23 DIAGNOSIS — R7989 Other specified abnormal findings of blood chemistry: Secondary | ICD-10-CM | POA: Diagnosis not present

## 2022-10-23 LAB — PATHOLOGIST SMEAR REVIEW

## 2022-10-23 LAB — MONONUCLEOSIS SCREEN: Mono Screen: NEGATIVE

## 2022-10-24 ENCOUNTER — Ambulatory Visit
Admission: RE | Admit: 2022-10-24 | Discharge: 2022-10-24 | Disposition: A | Discharge status: Home / Self Care | Payer: BC Managed Care – PPO | Source: Ambulatory Visit | Attending: Primary Care | Admitting: Primary Care

## 2022-10-24 DIAGNOSIS — R7989 Other specified abnormal findings of blood chemistry: Secondary | ICD-10-CM | POA: Diagnosis not present

## 2022-10-24 DIAGNOSIS — R945 Abnormal results of liver function studies: Secondary | ICD-10-CM | POA: Diagnosis not present

## 2022-10-25 LAB — HEPATIC FUNCTION PANEL
AG Ratio: 1.2 (calc) (ref 1.0–2.5)
ALT: 359 U/L — ABNORMAL HIGH (ref 6–29)
AST: 328 U/L — ABNORMAL HIGH (ref 10–35)
Albumin: 3.1 g/dL — ABNORMAL LOW (ref 3.6–5.1)
Alkaline phosphatase (APISO): 181 U/L — ABNORMAL HIGH (ref 37–153)
Bilirubin, Direct: 0.1 mg/dL (ref 0.0–0.2)
Globulin: 2.5 g/dL (calc) (ref 1.9–3.7)
Indirect Bilirubin: 0.4 mg/dL (calc) (ref 0.2–1.2)
Total Bilirubin: 0.5 mg/dL (ref 0.2–1.2)
Total Protein: 5.6 g/dL — ABNORMAL LOW (ref 6.1–8.1)

## 2022-10-25 LAB — TEST AUTHORIZATION

## 2022-10-25 LAB — HEPATITIS PANEL, ACUTE
Hep A IgM: NONREACTIVE
Hep B C IgM: NONREACTIVE
Hepatitis B Surface Ag: NONREACTIVE
Hepatitis C Ab: NONREACTIVE

## 2022-10-25 LAB — CMV ABS, IGG+IGM (CYTOMEGALOVIRUS)
CMV IgM: 136 AU/mL — ABNORMAL HIGH
Cytomegalovirus Ab-IgG: 9.7 U/mL — ABNORMAL HIGH

## 2022-10-28 ENCOUNTER — Other Ambulatory Visit: Payer: Self-pay | Admitting: Primary Care

## 2022-10-28 DIAGNOSIS — B259 Cytomegaloviral disease, unspecified: Secondary | ICD-10-CM

## 2022-11-01 ENCOUNTER — Other Ambulatory Visit (INDEPENDENT_AMBULATORY_CARE_PROVIDER_SITE_OTHER): Payer: BC Managed Care – PPO

## 2022-11-01 ENCOUNTER — Other Ambulatory Visit: Payer: Self-pay | Admitting: Primary Care

## 2022-11-01 DIAGNOSIS — B259 Cytomegaloviral disease, unspecified: Secondary | ICD-10-CM | POA: Diagnosis not present

## 2022-11-01 LAB — HEPATIC FUNCTION PANEL
ALT: 193 U/L — ABNORMAL HIGH (ref 0–35)
AST: 135 U/L — ABNORMAL HIGH (ref 0–37)
Albumin: 3.6 g/dL (ref 3.5–5.2)
Alkaline Phosphatase: 131 U/L — ABNORMAL HIGH (ref 39–117)
Bilirubin, Direct: 0.1 mg/dL (ref 0.0–0.3)
Total Bilirubin: 0.5 mg/dL (ref 0.2–1.2)
Total Protein: 6.3 g/dL (ref 6.0–8.3)

## 2022-11-01 LAB — CBC WITH DIFFERENTIAL/PLATELET
Basophils Absolute: 0.1 10*3/uL (ref 0.0–0.1)
Basophils Relative: 1 % (ref 0.0–3.0)
Eosinophils Absolute: 0.2 10*3/uL (ref 0.0–0.7)
Eosinophils Relative: 2.7 % (ref 0.0–5.0)
HCT: 36.1 % (ref 36.0–46.0)
Hemoglobin: 12.2 g/dL (ref 12.0–15.0)
Lymphocytes Relative: 63.5 % — ABNORMAL HIGH (ref 12.0–46.0)
Lymphs Abs: 4.2 10*3/uL — ABNORMAL HIGH (ref 0.7–4.0)
MCHC: 33.7 g/dL (ref 30.0–36.0)
MCV: 91.6 fl (ref 78.0–100.0)
Monocytes Absolute: 0.6 10*3/uL (ref 0.1–1.0)
Monocytes Relative: 8.9 % (ref 3.0–12.0)
Neutro Abs: 1.6 10*3/uL (ref 1.4–7.7)
Neutrophils Relative %: 23.9 % — ABNORMAL LOW (ref 43.0–77.0)
Platelets: 306 10*3/uL (ref 150.0–400.0)
RBC: 3.95 Mil/uL (ref 3.87–5.11)
RDW: 15.2 % (ref 11.5–15.5)
WBC: 6.6 10*3/uL (ref 4.0–10.5)

## 2022-11-19 ENCOUNTER — Other Ambulatory Visit (INDEPENDENT_AMBULATORY_CARE_PROVIDER_SITE_OTHER): Payer: BC Managed Care – PPO

## 2022-11-19 DIAGNOSIS — B259 Cytomegaloviral disease, unspecified: Secondary | ICD-10-CM

## 2022-11-19 LAB — HEPATIC FUNCTION PANEL
ALT: 38 U/L — ABNORMAL HIGH (ref 0–35)
AST: 37 U/L (ref 0–37)
Albumin: 4.1 g/dL (ref 3.5–5.2)
Alkaline Phosphatase: 82 U/L (ref 39–117)
Bilirubin, Direct: 0.1 mg/dL (ref 0.0–0.3)
Total Bilirubin: 0.6 mg/dL (ref 0.2–1.2)
Total Protein: 6.5 g/dL (ref 6.0–8.3)

## 2022-11-19 LAB — CBC WITH DIFFERENTIAL/PLATELET
Basophils Absolute: 0.1 10*3/uL (ref 0.0–0.1)
Basophils Relative: 1.7 % (ref 0.0–3.0)
Eosinophils Absolute: 0.2 10*3/uL (ref 0.0–0.7)
Eosinophils Relative: 4.6 % (ref 0.0–5.0)
HCT: 39.1 % (ref 36.0–46.0)
Hemoglobin: 13.4 g/dL (ref 12.0–15.0)
Lymphocytes Relative: 50.3 % — ABNORMAL HIGH (ref 12.0–46.0)
Lymphs Abs: 2.3 10*3/uL (ref 0.7–4.0)
MCHC: 34.3 g/dL (ref 30.0–36.0)
MCV: 90.1 fl (ref 78.0–100.0)
Monocytes Absolute: 0.4 10*3/uL (ref 0.1–1.0)
Monocytes Relative: 9.7 % (ref 3.0–12.0)
Neutro Abs: 1.5 10*3/uL (ref 1.4–7.7)
Neutrophils Relative %: 33.7 % — ABNORMAL LOW (ref 43.0–77.0)
Platelets: 228 10*3/uL (ref 150.0–400.0)
RBC: 4.34 Mil/uL (ref 3.87–5.11)
RDW: 13.7 % (ref 11.5–15.5)
WBC: 4.5 10*3/uL (ref 4.0–10.5)

## 2023-08-04 ENCOUNTER — Other Ambulatory Visit: Payer: Self-pay | Admitting: Primary Care

## 2023-08-04 DIAGNOSIS — N952 Postmenopausal atrophic vaginitis: Secondary | ICD-10-CM

## 2023-08-04 NOTE — Telephone Encounter (Signed)
Patient is due for CPE/follow up in November, this will be required prior to any further refills.  Please schedule, thank you!

## 2023-08-04 NOTE — Telephone Encounter (Signed)
Patient scheduled.

## 2023-10-13 ENCOUNTER — Other Ambulatory Visit: Payer: Self-pay | Admitting: Primary Care

## 2023-10-13 DIAGNOSIS — E039 Hypothyroidism, unspecified: Secondary | ICD-10-CM

## 2023-10-31 ENCOUNTER — Ambulatory Visit (INDEPENDENT_AMBULATORY_CARE_PROVIDER_SITE_OTHER): Payer: BC Managed Care – PPO | Admitting: Primary Care

## 2023-10-31 ENCOUNTER — Encounter: Payer: Self-pay | Admitting: Primary Care

## 2023-10-31 VITALS — BP 118/78 | HR 76 | Temp 97.2°F | Ht 64.75 in | Wt 127.0 lb

## 2023-10-31 DIAGNOSIS — Z0000 Encounter for general adult medical examination without abnormal findings: Secondary | ICD-10-CM | POA: Diagnosis not present

## 2023-10-31 DIAGNOSIS — E785 Hyperlipidemia, unspecified: Secondary | ICD-10-CM

## 2023-10-31 DIAGNOSIS — Z23 Encounter for immunization: Secondary | ICD-10-CM

## 2023-10-31 DIAGNOSIS — E039 Hypothyroidism, unspecified: Secondary | ICD-10-CM | POA: Diagnosis not present

## 2023-10-31 DIAGNOSIS — Z1231 Encounter for screening mammogram for malignant neoplasm of breast: Secondary | ICD-10-CM

## 2023-10-31 DIAGNOSIS — N952 Postmenopausal atrophic vaginitis: Secondary | ICD-10-CM | POA: Diagnosis not present

## 2023-10-31 LAB — COMPREHENSIVE METABOLIC PANEL
ALT: 19 U/L (ref 0–35)
AST: 21 U/L (ref 0–37)
Albumin: 4 g/dL (ref 3.5–5.2)
Alkaline Phosphatase: 63 U/L (ref 39–117)
BUN: 17 mg/dL (ref 6–23)
CO2: 30 meq/L (ref 19–32)
Calcium: 9.1 mg/dL (ref 8.4–10.5)
Chloride: 104 meq/L (ref 96–112)
Creatinine, Ser: 0.98 mg/dL (ref 0.40–1.20)
GFR: 63.69 mL/min (ref >60.00)
Glucose, Bld: 85 mg/dL (ref 70–99)
Potassium: 4.7 meq/L (ref 3.5–5.1)
Sodium: 140 meq/L (ref 135–145)
Total Bilirubin: 0.6 mg/dL (ref 0.2–1.2)
Total Protein: 6.5 g/dL (ref 6.0–8.3)

## 2023-10-31 LAB — LIPID PANEL
Cholesterol: 201 mg/dL — ABNORMAL HIGH (ref 0–200)
HDL: 60.4 mg/dL (ref >39.00)
LDL Cholesterol: 126 mg/dL — ABNORMAL HIGH (ref 0–99)
NonHDL: 140.68
Total CHOL/HDL Ratio: 3
Triglycerides: 73 mg/dL (ref 0.0–149.0)
VLDL: 14.6 mg/dL (ref 0.0–40.0)

## 2023-10-31 LAB — TSH: TSH: 3.26 u[IU]/mL (ref 0.35–5.50)

## 2023-10-31 MED ORDER — ESTRADIOL 0.1 MG/GM VA CREA: 1.0000 | TOPICAL_CREAM | VAGINAL | 1 refills | Status: DC

## 2023-10-31 NOTE — Assessment & Plan Note (Signed)
Improved.  Continue Estrace cream 1-3 times weekly. Refills provided.

## 2023-10-31 NOTE — Assessment & Plan Note (Signed)
Immunizations UTD. Influenza vaccine provided today.  Mammogram due in January 2025, orders placed. Colonoscopy UTD, due 2033  Discussed the importance of a healthy diet and regular exercise in order for weight loss, and to reduce the risk of further co-morbidity.  Exam stable. Labs pending.  Follow up in 1 year for repeat physical.

## 2023-10-31 NOTE — Assessment & Plan Note (Signed)
Commended her on regular exercise. Repeat lipid panel pending.

## 2023-10-31 NOTE — Patient Instructions (Signed)
Stop by the lab prior to leaving today. I will notify you of your results once received.   It was a pleasure to see you today!  

## 2023-10-31 NOTE — Progress Notes (Signed)
Subjective:    Patient ID: Destiny Leon, female    DOB: October 14, 1966, 57 y.o.   MRN: [id number]  HPI  Destiny Leon is a very pleasant 56 y.o. female who presents today for complete physical and follow up of chronic conditions.  Immunizations: -Tetanus: Completed in 2018 -Influenza: Influenza vaccine provided today.  -Shingles: Completed Shingrix series   Diet: Fair diet.  Exercise: No regular exercise.  Eye exam: Completes annually  Dental exam: Completes semi-annually    Pap Smear: Hysterectomy Mammogram: January 2023  Colonoscopy: Completed in 2023, due 2033  BP Readings from Last 3 Encounters:  10/31/23 118/78  10/21/22 106/70  12/20/21 112/85       Review of Systems  Constitutional:  Negative for unexpected weight change.  HENT:  Negative for rhinorrhea.   Respiratory:  Negative for cough and shortness of breath.   Cardiovascular:  Negative for chest pain.  Gastrointestinal:  Negative for constipation and diarrhea.  Genitourinary:  Negative for difficulty urinating.  Musculoskeletal:  Negative for arthralgias and myalgias.  Skin:  Negative for rash.  Allergic/Immunologic: Negative for environmental allergies.  Neurological:  Negative for dizziness, numbness and headaches.  Psychiatric/Behavioral:  The patient is not nervous/anxious.          Past Medical History:  Diagnosis Date   Chronic cough 04/27/2018   Dark urine 10/21/2022   Hypothyroidism    PONV (postoperative nausea and vomiting)     Social History   Socioeconomic History   Marital status: Married    Spouse name: Not on file   Number of children: Not on file   Years of education: Not on file   Highest education level: Bachelor's degree (e.g., BA, AB, BS)  Occupational History   Not on file  Tobacco Use   Smoking status: Never   Smokeless tobacco: Never  Substance and Sexual Activity   Alcohol use: No    Alcohol/week: 0.0 standard drinks of alcohol   Drug use: No   Sexual  activity: Not on file  Other Topics Concern   Not on file  Social History Narrative   Married.   1 child   Works in [employer]   Enjoys gardening, spending time with her son.    Social Determinants of Health   Financial Resource Strain: Low Risk  (10/30/2023)   Overall Financial Resource Strain (CARDIA)    Difficulty of Paying Living Expenses: Not hard at all  Food Insecurity: No Food Insecurity (10/30/2023)   Hunger Vital Sign    Worried About Running Out of Food in the Last Year: Never true    Ran Out of Food in the Last Year: Never true  Transportation Needs: No Transportation Needs (10/30/2023)   PRAPARE - Administrator, Civil Service (Medical): No    Lack of Transportation (Non-Medical): No  Physical Activity: Sufficiently Active (10/30/2023)   Exercise Vital Sign    Days of Exercise per Week: 6 days    Minutes of Exercise per Session: 60 min  Stress: No Stress Concern Present (10/30/2023)   [employer] of Occupational Health - Occupational Stress Questionnaire    Feeling of Stress : Only a little  Social Connections: Socially Integrated (10/30/2023)   Social Connection and Isolation Panel [NHANES]    Frequency of Communication with Friends and Family: More than three times a week    Frequency of Social Gatherings with Friends and Family: More than three times a week    Attends Religious Services: More than  4 times per year    Active Member of Clubs or Organizations: Yes    Attends Banker Meetings: More than 4 times per year    Marital Status: Married  Catering manager Violence: Not on file    Past Surgical History:  Procedure Laterality Date   ABDOMINAL HYSTERECTOMY  2007   COLONOSCOPY WITH PROPOFOL N/A 12/20/2021   Procedure: COLONOSCOPY WITH PROPOFOL;  Surgeon: Toney Reil, MD;  Location: [facility] END[facility];  Service: Gastroenterology;  Laterality: N/A;    Family History  Problem Relation Age of Onset   Heart disease Father     Hyperlipidemia Mother    Hypothyroidism Mother     No Known Allergies  Current Outpatient Medications on File Prior to Visit  Medication Sig Dispense Refill   levothyroxine (SYNTHROID) 75 MCG tablet TAKE 1 TABLET EVERY MORNING ON AN EMPTY STOMACH WITH WATER ONLY. NO FOOD OR OTHER MEDICATIONS FOR 30 MINUTES 90 tablet 0   nystatin (MYCOSTATIN) 100000 UNIT/ML suspension Take 5 mLs (500,000 Units total) by mouth 4 (four) times daily. 60 mL 0   No current facility-administered medications on file prior to visit.    BP 118/78   Pulse 76   Temp (!) 97.2 F (36.2 C) (Temporal)   Ht 5' 4.75" (1.645 m)   Wt 127 lb (57.6 kg)   SpO2 98%   BMI 21.30 kg/m  Objective:   Physical Exam HENT:     Right Ear: Tympanic membrane and ear canal normal.     Left Ear: Tympanic membrane and ear canal normal.  Eyes:     Pupils: Pupils are equal, round, and reactive to light.  Cardiovascular:     Rate and Rhythm: Normal rate and regular rhythm.  Pulmonary:     Effort: Pulmonary effort is normal.     Breath sounds: Normal breath sounds.  Abdominal:     General: Bowel sounds are normal.     Palpations: Abdomen is soft.     Tenderness: There is no abdominal tenderness.  Musculoskeletal:        General: Normal range of motion.     Cervical back: Neck supple.  Skin:    General: Skin is warm and dry.  Neurological:     Mental Status: She is alert and oriented to person, place, and time.     Cranial Nerves: No cranial nerve deficit.     Deep Tendon Reflexes:     Reflex Scores:      Patellar reflexes are 2+ on the right side and 2+ on the left side. Psychiatric:        Mood and Affect: Mood normal.           Assessment & Plan:  Preventative health care Assessment & Plan: Immunizations UTD. Influenza vaccine provided today.  Mammogram due in January 2025, orders placed. Colonoscopy UTD, due 2033  Discussed the importance of a healthy diet and regular exercise in order for weight loss,  and to reduce the risk of further co-morbidity.  Exam stable. Labs pending.  Follow up in 1 year for repeat physical.    Screening mammogram for breast cancer -     3D Screening Mammogram, Left and Right; Future  Encounter for immunization -     Flu vaccine trivalent PF, 6mos and older(Flulaval,Afluria,Fluarix,Fluzone)  Vaginal atrophy Assessment & Plan: Improved.  Continue Estrace cream 1-3 times weekly. Refills provided.  Orders: -     Estradiol; Place 1 Applicatorful vaginally 3 (three) times a week.  Dispense: 42.5 g; Refill: 1  Hypothyroidism, unspecified type Assessment & Plan: She is taking levothyroxine correctly.  Continue levothyroxine 75 mcg tablets daily. Repeat TSH pending.  Orders: -     TSH  Hyperlipidemia, unspecified hyperlipidemia type Assessment & Plan: Commended her on regular exercise. Repeat lipid panel pending.  Orders: -     Lipid panel -     Comprehensive metabolic panel        Doreene Nest, NP

## 2023-10-31 NOTE — Assessment & Plan Note (Signed)
She is taking levothyroxine correctly.  Continue levothyroxine 75 mcg tablets daily. Repeat TSH pending.

## 2023-12-18 ENCOUNTER — Ambulatory Visit
Admission: RE | Admit: 2023-12-18 | Discharge: 2023-12-18 | Disposition: A | Discharge status: Home / Self Care | Payer: BC Managed Care – PPO | Source: Ambulatory Visit | Attending: Primary Care | Admitting: Primary Care

## 2023-12-18 DIAGNOSIS — Z1231 Encounter for screening mammogram for malignant neoplasm of breast: Secondary | ICD-10-CM | POA: Diagnosis not present

## 2024-01-12 ENCOUNTER — Other Ambulatory Visit: Payer: Self-pay | Admitting: Primary Care

## 2024-01-12 DIAGNOSIS — E039 Hypothyroidism, unspecified: Secondary | ICD-10-CM

## 2024-10-07 ENCOUNTER — Other Ambulatory Visit: Payer: Self-pay | Admitting: Primary Care

## 2024-10-07 DIAGNOSIS — E039 Hypothyroidism, unspecified: Secondary | ICD-10-CM

## 2024-10-07 NOTE — Telephone Encounter (Signed)
Patient is due for CPE/follow up in late November, this will be required prior to any further refills.  Please schedule, thank you!

## 2024-10-14 ENCOUNTER — Telehealth: Payer: Self-pay | Admitting: Primary Care

## 2024-10-14 NOTE — Telephone Encounter (Signed)
 Copied from CRM #[id number]. Topic: Clinical - Medication Refill >> Oct 14, 2024  9:10 AM Thersia C wrote: Medication: estradiol  (ESTRACE ) 0.1 MG/GM vaginal cream  Has the patient contacted their pharmacy? Yes (Agent: If no, request that the patient contact the pharmacy for the refill. If patient does not wish to contact the pharmacy document the reason why and proceed with request.) (Agent: If yes, when and what did the pharmacy advise?)  This is the patient's preferred pharmacy:  [facility] - [place], [place] - [address] [place] [address] Phone: [phone] Fax: [phone]  Is this the correct pharmacy for this prescription? Yes If no, delete pharmacy and type the correct one.   Has the prescription been filled recently? No  Is the patient out of the medication? Yes  Has the patient been seen for an appointment in the last year OR does the patient have an upcoming appointment? Yes  Can we respond through MyChart? Yes  Agent: Please be advised that Rx refills may take up to 3 business days. We ask that you follow-up with your pharmacy.

## 2024-10-16 ENCOUNTER — Other Ambulatory Visit: Payer: Self-pay | Admitting: Primary Care

## 2024-10-16 DIAGNOSIS — N952 Postmenopausal atrophic vaginitis: Secondary | ICD-10-CM

## 2024-10-16 MED ORDER — ESTRADIOL 0.01 % VA CREA: TOPICAL_CREAM | VAGINAL | 0 refills | Status: AC

## 2024-10-16 NOTE — Telephone Encounter (Signed)
 Refills sent to pharmacy.

## 2024-11-02 ENCOUNTER — Encounter: Payer: Self-pay | Admitting: Primary Care

## 2024-11-02 ENCOUNTER — Ambulatory Visit (INDEPENDENT_AMBULATORY_CARE_PROVIDER_SITE_OTHER): Admitting: Primary Care

## 2024-11-02 VITALS — BP 116/70 | HR 65 | Temp 97.9°F | Ht 64.5 in | Wt 129.0 lb

## 2024-11-02 DIAGNOSIS — Z23 Encounter for immunization: Secondary | ICD-10-CM

## 2024-11-02 DIAGNOSIS — Z0001 Encounter for general adult medical examination with abnormal findings: Secondary | ICD-10-CM | POA: Diagnosis not present

## 2024-11-02 DIAGNOSIS — Z0000 Encounter for general adult medical examination without abnormal findings: Secondary | ICD-10-CM

## 2024-11-02 DIAGNOSIS — G479 Sleep disorder, unspecified: Secondary | ICD-10-CM | POA: Diagnosis not present

## 2024-11-02 DIAGNOSIS — L659 Nonscarring hair loss, unspecified: Secondary | ICD-10-CM | POA: Insufficient documentation

## 2024-11-02 DIAGNOSIS — E785 Hyperlipidemia, unspecified: Secondary | ICD-10-CM | POA: Diagnosis not present

## 2024-11-02 DIAGNOSIS — E039 Hypothyroidism, unspecified: Secondary | ICD-10-CM

## 2024-11-02 DIAGNOSIS — Z1231 Encounter for screening mammogram for malignant neoplasm of breast: Secondary | ICD-10-CM

## 2024-11-02 DIAGNOSIS — N952 Postmenopausal atrophic vaginitis: Secondary | ICD-10-CM

## 2024-11-02 DIAGNOSIS — Z1283 Encounter for screening for malignant neoplasm of skin: Secondary | ICD-10-CM

## 2024-11-02 LAB — CBC
HCT: 41.8 % (ref 36.0–46.0)
Hemoglobin: 13.9 g/dL (ref 12.0–15.0)
MCHC: 33.3 g/dL (ref 30.0–36.0)
MCV: 91.3 fl (ref 78.0–100.0)
Platelets: 257 K/uL (ref 150.0–400.0)
RBC: 4.58 Mil/uL (ref 3.87–5.11)
RDW: 13.3 % (ref 11.5–15.5)
WBC: 5.6 K/uL (ref 4.0–10.5)

## 2024-11-02 LAB — COMPREHENSIVE METABOLIC PANEL WITH GFR
ALT: 17 U/L (ref 0–35)
AST: 19 U/L (ref 0–37)
Albumin: 4.1 g/dL (ref 3.5–5.2)
Alkaline Phosphatase: 60 U/L (ref 39–117)
BUN: 25 mg/dL — ABNORMAL HIGH (ref 6–23)
CO2: 30 meq/L (ref 19–32)
Calcium: 9 mg/dL (ref 8.4–10.5)
Chloride: 103 meq/L (ref 96–112)
Creatinine, Ser: 0.88 mg/dL (ref 0.40–1.20)
GFR: 71.95 mL/min (ref >60.00)
Glucose, Bld: 91 mg/dL (ref 70–99)
Potassium: 4.2 meq/L (ref 3.5–5.1)
Sodium: 138 meq/L (ref 135–145)
Total Bilirubin: 0.7 mg/dL (ref 0.2–1.2)
Total Protein: 6.4 g/dL (ref 6.0–8.3)

## 2024-11-02 LAB — LIPID PANEL
Cholesterol: 176 mg/dL (ref 0–200)
HDL: 63.8 mg/dL (ref >39.00)
LDL Cholesterol: 97 mg/dL (ref 0–99)
NonHDL: 111.77
Total CHOL/HDL Ratio: 3
Triglycerides: 74 mg/dL (ref 0.0–149.0)
VLDL: 14.8 mg/dL (ref 0.0–40.0)

## 2024-11-02 LAB — TSH: TSH: 5.45 u[IU]/mL (ref 0.35–5.50)

## 2024-11-02 NOTE — Assessment & Plan Note (Addendum)
 Could be hormonal.  TSH pending. Discussed OTC products. We discussed healthy bedtime routine.  She kindly declines prescription treatment.

## 2024-11-02 NOTE — Assessment & Plan Note (Signed)
 Referral placed to dermatology

## 2024-11-02 NOTE — Patient Instructions (Addendum)
 Stop by the lab prior to leaving today. I will notify you of your results once received.   [facility].   If not already doing so, I recommend you start taking Calcium 1200 mg with Vitamin D 800 units everyday.  Weight bearing exercise is also very important in bone strength, make sure to exercise regularly.   You will either be contacted via phone regarding your referral to dermatology, or you may receive a letter on your MyChart portal from our referral team with instructions for scheduling an appointment. Please let us  know if you have not been contacted by anyone within two weeks.  It was a pleasure to see you today!

## 2024-11-02 NOTE — Assessment & Plan Note (Signed)
 Repeat lipid panel pending.

## 2024-11-02 NOTE — Assessment & Plan Note (Signed)
 Immunizations UTD. Influenza vaccine provided today.  Mammogram due in January 2025, orders placed. Colonoscopy UTD, due 2033  Discussed the importance of a healthy diet and regular exercise in order for weight loss, and to reduce the risk of further co-morbidity.  Exam stable. Labs pending.  Follow up in 1 year for repeat physical.

## 2024-11-02 NOTE — Assessment & Plan Note (Signed)
 She is taking levothyroxine  at bedtime. Discussed correct instructions.  Continue levothyroxine  75 mcg daily. Repeat TSH pending.

## 2024-11-02 NOTE — Assessment & Plan Note (Signed)
 No concerns today. Continue Estrace  0.01% cream.

## 2024-11-02 NOTE — Progress Notes (Signed)
 Subjective:    Patient ID: Destiny Leon, female    DOB: 1966/04/30, 58 y.o.   MRN: [id number]  Destiny Leon is a very pleasant 58 y.o. female who presents today for complete physical and follow up of chronic conditions.  She would also like to discuss sleep disturbance. She has difficulty staying asleep. Will wake up a few hours later to use the bathroom, takes several hours to fall back asleep, sometimes will not fall back asleep. Over this year she's also noticed abdominal bloating which has improved with dietary changes. She stops drinking water around 7 pm, goes to bed around 9:30 pm. She stops screen time 1 hour before bed. She's tried melatonin gummies which sometimes helps. She's also noticed thinning eye brows.    She has a family history of osteoporosis in both maternal grandmother and mother. She has questions regarding prevention. She exercises regularly, does not take calcium or vitamin D.  Immunizations: -Tetanus: Completed in 2018 -Influenza: Influenza vaccine provided today.  -Shingles: Completed Shingrix  series   Diet: Fair diet.  Exercise: Regular exercise.  Eye exam: Completes annually  Dental exam: Completes semi-annually    Pap Smear: Hysterectomy  Mammogram: Completed in January 2025  Colonoscopy: Completed in 2023, due 2033  BP Readings from Last 3 Encounters:  11/02/24 116/70  10/31/23 118/78  10/21/22 106/70   Wt Readings from Last 3 Encounters:  11/02/24 129 lb (58.5 kg)  10/31/23 127 lb (57.6 kg)  10/21/22 127 lb (57.6 kg)      Review of Systems  Constitutional:  Negative for unexpected weight change.  HENT:  Negative for rhinorrhea.   Respiratory:  Negative for cough and shortness of breath.   Cardiovascular:  Negative for chest pain.  Gastrointestinal:  Negative for constipation and diarrhea.  Genitourinary:  Negative for difficulty urinating.  Musculoskeletal:  Negative for arthralgias and myalgias.  Skin:  Negative for rash.   Allergic/Immunologic: Negative for environmental allergies.  Neurological:  Negative for dizziness and headaches.  Psychiatric/Behavioral:  The patient is not nervous/anxious.          Past Medical History:  Diagnosis Date   Chronic cough 04/27/2018   Dark urine 10/21/2022   Hypothyroidism    PONV (postoperative nausea and vomiting)     Social History   Socioeconomic History   Marital status: Married    Spouse name: Not on file   Number of children: Not on file   Years of education: Not on file   Highest education level: Bachelor's degree (e.g., BA, AB, BS)  Occupational History   Not on file  Tobacco Use   Smoking status: Never   Smokeless tobacco: Never  Substance and Sexual Activity   Alcohol use: No   Drug use: Never   Sexual activity: Yes    Birth control/protection: Other-see comments    Comment: Hysterectomy  Other Topics Concern   Not on file  Social History Narrative   Married.   1 child   Works in [employer]   Enjoys gardening, spending time with her son.    Social Drivers of Health   Financial Resource Strain: Patient Declined (11/01/2024)   Overall Financial Resource Strain (CARDIA)    Difficulty of Paying Living Expenses: Patient declined  Food Insecurity: Patient Declined (11/01/2024)   Hunger Vital Sign    Worried About Running Out of Food in the Last Year: Patient declined    Ran Out of Food in the Last Year: Patient declined  Transportation Needs: Patient  Declined (11/01/2024)   PRAPARE - Transportation    Lack of Transportation (Medical): Patient declined    Lack of Transportation (Non-Medical): Patient declined  Physical Activity: Sufficiently Active (11/01/2024)   Exercise Vital Sign    Days of Exercise per Week: 7 days    Minutes of Exercise per Session: 40 min  Stress: No Stress Concern Present (11/01/2024)   [employer] of Occupational [employer] - Occupational Stress Questionnaire    Feeling of Stress: Not at all  Social  Connections: Unknown (11/01/2024)   Social Connection and Isolation Panel    Frequency of Communication with Friends and Family: Patient declined    Frequency of Social Gatherings with Friends and Family: Patient declined    Attends Religious Services: Patient declined    Active Member of Clubs or Organizations: Patient declined    Attends Banker Meetings: Not on file    Marital Status: Married  Catering Manager Violence: Not on file    Past Surgical History:  Procedure Laterality Date   ABDOMINAL HYSTERECTOMY  2007   AUGMENTATION MAMMAPLASTY Bilateral    COLONOSCOPY WITH PROPOFOL  N/A 12/20/2021   Procedure: COLONOSCOPY WITH PROPOFOL ;  Surgeon: Unk Corinn Skiff, MD;  Location: [facility] END[facility]COPY;  Service: Gastroenterology;  Laterality: N/A;    Family History  Problem Relation Age of Onset   Heart disease Father    Hyperlipidemia Mother    Hypothyroidism Mother    Heart disease Mother    Hyperlipidemia Brother     No Known Allergies  Current Outpatient Medications on File Prior to Visit  Medication Sig Dispense Refill   estradiol  (ESTRACE ) 0.01 % CREA vaginal cream Insert 0.5 to 1 gram vaginally three times weekly. 42.5 g 0   levothyroxine  (SYNTHROID ) 75 MCG tablet TAKE 1 TABLET EVERY MORNING ON AN EMPTY STOMACH WITH WATER ONLY. NO FOOD OR OTHER MEDICATIONS FOR 30 MINUTES 90 tablet 0   No current facility-administered medications on file prior to visit.    BP 116/70   Pulse 65   Temp 97.9 F (36.6 C) (Oral)   Ht 5' 4.5 (1.638 m)   Wt 129 lb (58.5 kg)   SpO2 98%   BMI 21.80 kg/m  Objective:   Physical Exam HENT:     Right Ear: Tympanic membrane and ear canal normal.     Left Ear: Tympanic membrane and ear canal normal.  Eyes:     Pupils: Pupils are equal, round, and reactive to light.  Cardiovascular:     Rate and Rhythm: Normal rate and regular rhythm.  Pulmonary:     Effort: Pulmonary effort is normal.     Breath sounds: Normal breath sounds.   Abdominal:     General: Bowel sounds are normal.     Palpations: Abdomen is soft.     Tenderness: There is no abdominal tenderness.  Musculoskeletal:        General: Normal range of motion.     Cervical back: Neck supple.  Skin:    General: Skin is warm and dry.  Neurological:     Mental Status: She is alert and oriented to person, place, and time.     Cranial Nerves: No cranial nerve deficit.     Deep Tendon Reflexes:     Reflex Scores:      Patellar reflexes are 2+ on the right side and 2+ on the left side. Psychiatric:        Mood and Affect: Mood normal.     Physical Exam  Assessment & Plan:  Preventative health care Assessment & Plan: Immunizations UTD. Influenza vaccine provided today.   Mammogram due in January 2025, orders placed. Colonoscopy UTD, due 2033  Discussed the importance of a healthy diet and regular exercise in order for weight loss, and to reduce the risk of further co-morbidity.  Exam stable. Labs pending.  Follow up in 1 year for repeat physical.    Hypothyroidism, unspecified type Assessment & Plan: She is taking levothyroxine  at bedtime. Discussed correct instructions.  Continue levothyroxine  75 mcg daily. Repeat TSH pending.  Orders: -     TSH -     CBC  Vaginal atrophy Assessment & Plan: No concerns today. Continue Estrace  0.01% cream.   Hyperlipidemia, unspecified hyperlipidemia type Assessment & Plan: Repeat lipid panel pending.  Orders: -     Lipid panel -     Comprehensive metabolic panel with GFR -     CBC  Screening mammogram for breast cancer -     3D Screening Mammogram, Left and Right; Future  Screening for skin cancer -     Ambulatory referral to Dermatology  Hair thinning Assessment & Plan: Referral placed to dermatology  Orders: -     Ambulatory referral to Dermatology  Need for influenza vaccination -     Flu vaccine trivalent PF, 6mos and  older(Flulaval,Afluria,Fluarix,Fluzone)  Sleep disturbance Assessment & Plan: Could be hormonal.  TSH pending. Discussed OTC products. We discussed healthy bedtime routine.  She kindly declines prescription treatment.      Assessment and Plan Assessment & Plan         Comer MARLA Gaskins, NP    History of Present Illness

## 2024-11-03 ENCOUNTER — Ambulatory Visit: Payer: Self-pay | Admitting: Primary Care

## 2024-11-03 DIAGNOSIS — E039 Hypothyroidism, unspecified: Secondary | ICD-10-CM

## 2024-11-03 MED ORDER — LEVOTHYROXINE SODIUM 88 MCG PO TABS: ORAL_TABLET | ORAL | 0 refills | Status: DC

## 2024-11-29 ENCOUNTER — Ambulatory Visit

## 2024-12-20 ENCOUNTER — Ambulatory Visit

## 2024-12-20 DIAGNOSIS — Z1231 Encounter for screening mammogram for malignant neoplasm of breast: Secondary | ICD-10-CM

## 2024-12-23 ENCOUNTER — Ambulatory Visit

## 2024-12-23 DIAGNOSIS — D1801 Hemangioma of skin and subcutaneous tissue: Secondary | ICD-10-CM | POA: Diagnosis not present

## 2024-12-23 DIAGNOSIS — L578 Other skin changes due to chronic exposure to nonionizing radiation: Secondary | ICD-10-CM | POA: Diagnosis not present

## 2024-12-23 DIAGNOSIS — L988 Other specified disorders of the skin and subcutaneous tissue: Secondary | ICD-10-CM

## 2024-12-23 DIAGNOSIS — D229 Melanocytic nevi, unspecified: Secondary | ICD-10-CM

## 2024-12-23 DIAGNOSIS — W908XXA Exposure to other nonionizing radiation, initial encounter: Secondary | ICD-10-CM

## 2024-12-23 DIAGNOSIS — L814 Other melanin hyperpigmentation: Secondary | ICD-10-CM

## 2024-12-23 DIAGNOSIS — L820 Inflamed seborrheic keratosis: Secondary | ICD-10-CM

## 2024-12-23 DIAGNOSIS — L821 Other seborrheic keratosis: Secondary | ICD-10-CM | POA: Diagnosis not present

## 2024-12-23 DIAGNOSIS — Z1283 Encounter for screening for malignant neoplasm of skin: Secondary | ICD-10-CM

## 2024-12-23 MED ORDER — TRETINOIN 0.025 % EX CREA: TOPICAL_CREAM | Freq: Every day | CUTANEOUS | 5 refills | Status: AC

## 2024-12-23 NOTE — Patient Instructions (Addendum)
 Skin Care Recommendations   In the morning: - Cleanse face with a gentle cleanser   - Apply an gentle moisturizer - Apply a sunscreen   Optional add ins: Vitamin C, hyaluronic acid, peptides  In the evening: - Cleanse face with a regular gentle face wash - Wait for skin to completely dry - Use a topical retinoid followed by a moisturizer   Optional add ins: hyaluronic acid, peptides, eye creams   Apply your products from thinnest to thickest -- watery serums first, then creams, then oils, and sunscreen last in the morning. This helps everything absorb properly and gives your skin the best results  Sunscreen should always be your final step in the morning -- Tips for Use: - Aim for SPF >30  - Apply every morning, even on cloudy days. - Use ~ teaspoon for face; reapply every 2 hours outdoors. This is about the length of two fingertips.  - Squeeze out two fingertip units of cream -- one for the face, one for the neck. This is about  teaspoon, which ensures full coverage. - Tinted options help protect against visible light, important for pigmentation concerns. - Lightweight, oil-free options are preferred for acne-prone or oily skin.  Gentle Cleanser (Morning & Night)  A good cleanser removes dirt, oil, and makeup without stripping your skins natural barrier. Look for gentle, fragrance-free formulas. Recommend products like Cetaphil, CeraVe, La Roche-Posay, Neutrogena, Avene.  Cetaphil Gentle Skin Cleanser - classic sensitive skin favorite (~$6-$12) CeraVe Hydrating Facial Cleanser - hydrating with ceramides (~$6-$11) La Roche Posay Toleriane Hydrating Gentle Cleanser - creamy soothing option (~$14-$16) Neutrogena Ultra Gentle Daily Cleanser - very gentle option (~$9-$11) Vanicream Gentle Facial Cleanser - great budget sensitive choice (~$9-$13) Avene Cleanance HYRA Cleanser ($20-30)   Tips: Massage gently with lukewarm water, rinse well, and pat dry before moisturizing.  Gentle  Moisturizer (Morning & Night)  Hydrating moisturizers help support your skins barrier and reduce dryness or irritation.Look for gentle, fragrance-free, non comedogenic formulas.  Neutrogena Hydro Boost Water Gel - lightweight hydrating gel (~$19) Neutrogena Hydro Boost Water Cream - richer hydration (~$19-$25) Avne Tolerance Hydra-10 Hydrating Cream - very sensitive skin (+48h hydration) (~$26-$28) Avne Tolerance Control Soothing Skin Recovery Balm - soothing balm for irritated/dry skin (~$38) Avne Cicalfate+ Restorative Protective Cream - excellent for barrier support (~$26)  Sunscreen (EVERY morning)  Sunscreens protect against UV and visible light, helping prevent sunburn, photoaging, and pigmentation issues. Choose formulations based on skin type, sensitivity, and cosmetic preferences  EltaMD UV Daily SPF 40 - lightweight lotion with antioxidants and hyaluronic acid; good for normal to dry, sensitive skin (~$35-$40)  EltaMD UV Clear SPF 46 - lightweight, oil-free fluid with niacinamide; ideal for acne-prone, rosacea, or sensitive skin (~$36-$40)  EltaMD Tinted UV SPF 46 - tinted lotion with iron oxides; protects against visible light, excellent for hyperpigmentation and darker skin tones (~$36-$40)  EltaMD Recovery SPF 30 - soothing, antioxidant-rich lotion; best for dry, reactive, or post-procedure skin (~$38)  La Roche-Posay Anthelios SPF 60 - fast-absorbing, broad-spectrum UVA/UVB protection; suitable for all skin types, including sensitive or sun-sensitive (~$36-$40)  Eucerin Tinted Sunscreen SPF 50 - tinted cream with visible light protection; moisturizing, great for melasma or pigmentation-prone skin (~$16-$20)  TiZO Tinted SPF 40 (or SPF 40+ / SPF 100) - mineral, iron-oxide-rich tint; excellent for visible light protection & melasma with broad spectrum (~$30-$45)  ISDIN Eryfotona Actinica SPF 50+ - mineral sunscreen with DNA repair enzymes; good for photo-damaged or high-sun-exposure  skin (~$39-$45)  ISDIN Fusion Water SPF 50+ - light, water-like texture, great under makeup and for oily/combination skin (~$32-$38)  Neutrogena Ultra Sheer Dry-Touch SPF 55 - non-greasy, lightweight lotion with strong SPF, great for everyday use (~$10-$15)   Topical Retinoids  Topical retinoids help improve acne, fine lines, hyperpigmentation, and overall skin texture. Start slowly to reduce irritation and always use sunscreen during the day. There are over the counter and prescription options, prescription options tend to be more effective.   CeraVe Resurfacing Retinol Serum (~$18-$22) - gentle, ceramide + niacinamide formula great for beginners and sensitive skin  La Roche-Posay Retinol B3 Serum (~$44-$47) - dermatologist-trusted anti-aging serum with niacinamide for soothing and tone  Avene Retrinal 0.1% Intensive Multi-Corrective Cream - potent retinaldehyde (a step up from retinol) cream for wrinkles, smoothness & radiance (~$52-$68)  Avene [place]   Vitamin C Vitamin?C (especially in stable forms like L-ascorbic acid or derivatives) helps brighten dull skin, reduce dark spots, protect against free radicals, and support collagen health. CeraVe Skin Renewing Serum - 10% vitamin?C with ceramides & hyaluronic acid; gentle and hydrating, excellent first serum (~$18-$22). La Roche-Posay 10% Pure Vitamin C Serum - dermatologist-trusted brand with 10% pure vitamin?C; brightens & smooths (~$35-$40). Naturium Vitamin C Complex Serum - blend of vitamin?C forms + humectants; balanced and less irritating. SkinCeuticals C E Ferulic - gold-standard antioxidant with 15% L-ascorbic acid + vitamin E + ferulic acid; excellent anti-aging & radiance protection (~$140-$185).  SkinCeuticals Phloretin CF - vitamin C + phloretin + ferulic acid; great for discoloration & uneven tone (~$185). Alastin C-Radical Defense Antioxidant Serum - stable vitamin C (sodium ascorbate)-based antioxidant; protects and supports radiance  with a silky texture (~$150-$200) RoC Multi Correxion Revive + Glow Daily Serum - 10% vitamin C + peptides for brightening, firmness & glow Vichy Liftactiv Vitamin C Serum - 15% pure vitamin C + hyaluronic acid; radiance & fine line support (~$30-$40) Avne Vitamin Activ Cg Serum - gentle vitamin C derivative + antioxidants; good for sensitive or reactive skin  Hyaluronic Acid  Hyaluronic acid (HA) is a humectant that draws moisture into skin and helps it stay plump and hydrated. Apply AM &/or PM after cleansing and before moisturizer. CeraVe Hydrating Hyaluronic Acid Serum - With ceramides & HA; supports skin barrier + hydration (~$18-$22). Neutrogena Hydro Boost Hyaluronic Acid Serum - Gel-like texture with high & low-weight HA; great for every skin type (~$20-$30). L'Oral Revitalift Pure Hyaluronic Acid Serum - Affordable 1.5% HA blend for plumping + smoothness (~$13-$30). SkinCeuticals Hydrating B5 Gel (Hyaluronic Acid Intensifier) - potent HA serum with supportive glycerin and vitamin B5; excellent for dry/mature skin (~$120) La Roche-Posay Hyalu B5 Pure Hyaluronic Acid Serum - pure HA with vitamin B5 & madecassoside; sensitive skin-friendly, anti-aging hydration (~$40-$55)  Vichy Minral 89 Hyaluronic Acid Face Serum - water-gel booster with volcanic water & HA; great for every skin type and under makeup (~$30-$40)  Peptides Peptides are chains of amino acids that help support collagen and elastin production, hydration, and skin resilience. The Ordinary Multi-Peptide + HA Serum - Broad peptide blend with hyaluronic acid; targets multiple signs of aging (~$20-$35). COSRX The 6 Peptide Skin Booster Serum - 6 peptide types to help texture, firmness & hydration (~$20-$30) SkinCeuticals P-TIOX Serum - Peptide-plus synergy serum with PHA and niacinamide for expression lines & radiance ($185) Good Molecules Super Peptide Serum - Copper peptides + multi-peptide support for hydration & smoothness  (~$12-$18). ZO Skin Health Peptide Facial Refining Concentrate - advanced peptide blend targeting expression + static wrinkles, facial  contour, elasticity, and volume ($265)   Eye Creams  Eye creams are formulated for the delicate under-eye area and can help with hydration, support collagen/turnover (retinol/retinal), and address dark circles or puffiness. Start slowly with retinoids (1-3/week at night) and always use AM SPF.  SkinBetter EyeMax AlphaRet Overnight Cream - combines retinol + AHA for fine lines, texture, wrinkles, and tone (~$125; night use)   RoC Retinol Correxion Line Smoothing Eye Cream - dermatologist-favorite drugstore retinol eye cream for fine lines, dark circles & puffiness (~$30)   La Roche-Posay Redermic Retinol Eye Cream - gentle pure retinol under-eye cream for lines & texture (~$45-$55)  La Roche-Posay Pigmentclar Eyes - caffeine + brightening ingredients for dark circles & puffiness (~$30-$40)   Neutrogena Hydro Boost Under Eye Gel-Cream - hyaluronic acid for deep hydration, great under makeup (~$18-$30)   CeraVe Eye Repair Cream - ceramides + hyaluronic acid; gentle all-purpose support  Other popular skincare ingredients: Azaleic Acid: Helps with acne, rosacea, and pigmentation; gentle enough for sensitive skin and fights redness and dark marks. Its a dermatologist-favorite for multi-purpose brightening and clarity. Bakuchiol: A plant-derived retinol alternative that triggers similar collagen-boosting effects as retinol but with less irritation, especially good for sensitive or retinoid-intolerant skin Tranxemic acid: A gentle acid that targets hyperpigmentation and melasma, now favored for brightening without irritation.  Over the counter acne treatments:   CeraVe SA Cleanser - salicylic acid face cleanser with ceramides; gentle daily use for bumpy, rough, acne-prone skin  La Roche-Posay Effaclar Salicylic Acid Acne Treatment Serum - 1.5% BHA serum that targets  acne blemishes, pores, and post-acne marks with a triple acid complex (salicylic + glycolic + LHA) and soothing thermal water; start once daily then increase as tolerated.  The Ordinary Mandelic Acid 10% + HA - classic 10% mandelic acid for gentle exfoliation, smooth texture, and brightness  Naturium Mandelic Topical Acid 12% - mandelic acid + fruit AHAs with niacinamide for dark spots & texture  Lip Products for dryness  Plain vaseline or aquaphor  Cortibalm - good for very painful, cracked, chapped lips. Has low dose of steroid which helps with irritation Clinique moisture surge lip balm  Lipsmart Ultrahydrating lip treatment  LaRoche Posay cicaplast lip balm   Lip products with SPF Tizo tinted lip protection EltaMD UV Lipbalm     Cryosurgery  Cryosurgery (freezing) uses liquid nitrogen to destroy certain types of skin lesions. Lowering the temperature of the lesion in a small area surrounding skin destroys the lesion. Immediately following cryosurgery, you will notice redness and swelling of the treatment area. Blistering or weeping may occur, lasting approximately one week which will then be followed by crusting. Most areas will heal completely in 10 to 14 days.  Wash the treated areas daily. Allow soap and water to run over the areas, but do not scrub. Should a scab or crust form, allow it to fall off on its own. Do not remove or pick at it. Application of an ointment  and a bandage may make you feel more comfortable, but it is not necessary. Some people develop an allergy to Neosporin, so we recommend that Vaseline or  Aquaphor be used.  The cryotherapy site will be more sensitive than your surrounding skin. Keep it covered, and remember to apply sunscreen every day to all your sun exposed skin. A scar may remain which is lighter or pinker than your normal skin. Your body will continue to improve your scar for up to one year; however a light-colored scar may  remain.  Infection  following cryotherapy is rare. However if you are worried about the appearance of the treated area, contact your doctor. We have a physician on call at all times. If you have any concerns about the site, please call our clinic at [phone]   Melanoma  Melanoma is the most dangerous type of skin cancer, and is the leading cause of death from skin disease.  You are more likely to develop melanoma if you: Have light-colored skin, light-colored eyes, or red or blond hair Spend a lot of time in the sun Tan regularly, either outdoors or in a tanning bed Have had blistering sunburns, especially during childhood Have a close family member who has had a melanoma Have atypical moles or large birthmarks  The first sign of melanoma is often a change in a mole or a new dark spot.  The ABCDE system is a way of remembering the signs of melanoma. A for asymmetry:  The two halves do not match. B for border:  The edges of the growth are irregular. C for color:  A mixture of colors are present instead of an even brown color. D for diameter:  Melanomas are usually (but not always) greater than 6mm - the size of a pencil eraser. E for evolution:  The spot keeps changing in size, shape, and color. If your doctor thinks that you might have a skin cancer, he or she will perform a biopsy.  A piece of skin is removed and sent to a laboratory for examination under a microscope.  This is the best test for melanoma.  Melanoma is almost always treated with surgery.  The cancer and an area of skin surrounding it are removed.  For early melanomas, this is sometimes the only treatment that is necessary.  For some melanomas, a sentinel node biopsy will be performed.  When melanoma starts to spread to other parts of the body, it will usually go to a nearby lymph node first.  This is called the sentinel lymph node, and it can be found with a special x-ray test.  The surgeon can biopsy this lymph node to see if the cancer has  already spread.  When melanoma has spread to other organs, treatment is much more difficult.  Metastatic melanoma is melanoma that is found elsewhere in the body.  It usually cannot be cured.  Treatment involves shrinking the cancer spots for comfort and for the longest possible survival time.  This can be done with chemotherapy, immune therapies, radiation, or additional surgery.  How well a patient does depends on many things, but the most important of these is how quickly the cancer was found.  If caught early, melanoma can be cured.  Doctors use the depth of the melanoma as a measure of how likely it is that the melanoma has spread.  Melanomas less than 1mm deep in the skin have very little chance of having spread, whereas melanomas deeper than 4mm are more likely to have spread to other organs.  If you have had a melanoma and have been successfully treated, it is very important to examine your skin regularly for any changes.  Frequent skin exams by a doctor are recommended.  Often these are done every three months for the first two years after a melanoma, and every six to twelve months after that.  Your risk for melanoma is increased after you have had one.  Melanoma may return years later.   Sunscreen  Who needs sunscreen? Everyone. Sunscreen  use can help prevent skin cancer by protecting you from the sun's harmful ultraviolet rays. Anyone can get skin cancer, regardless of age, gender or race. In fact, it is estimated that one in five Americans will develop skin cancer in their lifetime.  Sunscreen alone cannot fully protect you. In addition to wearing sunscreen, dermatologists recommend taking the following steps to protect your skin and find skin cancer early:  Seek shade when appropriate, remembering that the sun's rays are strongest between 10 a.m. and 2 p.m. If your shadow is shorter than you are, seek shade. Dress to protect yourself from the sun by wearing a lightweight long-sleeved  shirt, pants, a wide-brimmed hat and sunglasses, when possible.  Use extra caution near water, snow and sand as they reflect the damaging rays of the sun, which can increase your chance of sunburn.  Get vitamin D safely through a healthy diet that may include vitamin supplements. Don't seek the sun. Avoid tanning beds. Ultraviolet light from the sun and tanning beds can cause skin cancer and wrinkling. If you want to look tan, you may wish to use a self-tanning product, but continue to use sunscreen with it.  When should I use sunscreen? Every day you go outside--even if you're just walking to and from your form of transportation. The sun emits harmful UV rays year-round. Even on cloudy days, up to 80 percent of the sun's harmful UV rays can penetrate your skin. Snow, sand and water increase the need for sunscreen because they reflect the sun's rays.  How much sunscreen should I use, and how often should I apply it? Most people only apply 25-50 percent of the recommended amount of sunscreen. Apply enough sunscreen to cover all exposed skin. Most adults need about 1 ounce -- or enough to fill a shot glass -- to fully cover their body.  Don't forget to apply to the tops of your feet, your neck, your ears and the top of your head. Apply sunscreen to dry skin 15 minutes before going outdoors.  Skin cancer also can form on the lips. To protect your lips, apply a lip balm or lipstick that contains sunscreen with an SPF of 30 or higher.  When outdoors, reapply sunscreen approximately every two hours, or after swimming or sweating, according to the directions on the bottle.   Broad-spectrum sunscreens protect against both UVA and UVB rays. What is the difference between the rays? Sunlight consists of two types of harmful rays that reach the earth -- UVA rays and UVB rays. Overexposure to either can lead to skin cancer. In addition to causing skin cancer, here's what each of these rays do:  UVA rays (or aging  rays) can prematurely age your skin, causing wrinkles and age spots, and can pass through window glass. UVB rays (or burning rays) are the primary cause of sunburn and are blocked by window glass  There is no safe way to tan. Every time you tan, you damage your skin. As this damage builds, you speed up the aging of your skin and increase your risk for all types of skin cancer.  What is the difference between chemical and physical sunscreens? Chemical sunscreens work like a sponge, absorbing the sun's rays. They contain one or more of the following active ingredients: oxybenzone, avobenzone, octisalate, octocrylene, homosalate and octinoxate. These formulations tend to be easier to rub into the skin without leaving a white residue.   Physical sunscreens work like a shield, sitting sit on the surface of  your skin and deflecting the sun's rays. They contain the active ingredients zinc oxide and/or titanium dioxide. Use this sunscreen if you have sensitive skin.   What type of sunscreen should I use? The best type of sunscreen is the one you will use again and again. Just make sure it offers broad-spectrum (UVA and UVB) protection, has an SPF of 30+, and is water-resistant. The kind of sunscreen you use is a matter of personal choice, and may vary depending on the area of the body to be protected. Available sunscreen options include lotions, creams, gels, ointments, wax sticks and sprays.  Recommended physical sunscreens for face: - Neutrogena Sheer Zinc - Aveeno Positively Mineral Sensitive - CeraVe Hydrating Mineral (also has a tinted version) - La Roche-Posay Anthelios Mineral Face (comes as a cream, lotion, light fluid, and there is also a tinted version).  - EltaMD UV Clear (also has a tinted version)  Recommended physical sunscreens for body: - Neutrogena Sheer Zinc Dry-Touch Sunscreen Sensitive Skin Lotion Broad Spectrum SPF 50 - Aveeno Positively Mineral Sensitive Skin Sunscreen Broad  Spectrum SPF 50 - La Roche-Posay Anthelios SPF 50 Mineral Sunscreen - Gentle Lotion - CeraVe Hydrating Mineral Sunscreen SPF 50  Recommended chemical sunscreens for face: - Anthelios UV Correct Face Sunscreen SPF 70 with Niacinamide - Neutrogena Clear Face Oil-Free SPF 50 with Helioplex - Neutrogena Sport Face Oil-Free SPF 70+ with Helioplex - Aveeno Protect + Hydrate Sunscreen For Face SPF 70 - La Roche-Posay Anthelios Light Fluid Sunscreen for Face SPF 60  Recommended chemical sunscreens for body: - Neutrogena Ultra Sheer Dry-Touch Sunscreen SPF 70 - Aveeno Protect + Hydrate Broad Spectrum All-Day Hydration SPF 60 (comes in a big pump) - La Roche-Posay Anthelios Melt-In Milk Sunscreen SPF 60   Due to recent changes in healthcare laws, you may see results of your pathology and/or laboratory studies on MyChart before the doctors have had a chance to review them. We understand that in some cases there may be results that are confusing or concerning to you. Please understand that not all results are received at the same time and often the doctors may need to interpret multiple results in order to provide you with the best plan of care or course of treatment. Therefore, we ask that you please give us  2 business days to thoroughly review all your results before contacting the office for clarification. Should we see a critical lab result, you will be contacted sooner.   If You Need Anything After Your Visit  If you have any questions or concerns for your doctor, please call our main line at [phone] and press option 4 to reach your doctor's medical assistant. If no one answers, please leave a voicemail as directed and we will return your call as soon as possible. Messages left after 4 pm will be answered the following business day.   You may also send us  a message via MyChart. We typically respond to MyChart messages within 1-2 business days.  For prescription refills, please ask your  pharmacy to contact our office. Our fax number is [phone].  If you have an urgent issue when the clinic is closed that cannot wait until the next business day, you can page your doctor at the number below.    Please note that while we do our best to be available for urgent issues outside of office hours, we are not available 24/7.   If you have an urgent issue and are unable to reach us , you may choose  to seek medical care at your doctor's office, retail clinic, urgent care center, or emergency room.  If you have a medical emergency, please immediately call 911 or go to the emergency department.  Pager Numbers  - Dr. Hester: [phone]  - Dr. Jackquline: [phone]  - Dr. Claudene: [phone]   - Dr. Raymund: [phone]  In the event of inclement weather, please call our main line at [phone] for an update on the status of any delays or closures.  Dermatology Medication Tips: Please keep the boxes that topical medications come in in order to help keep track of the instructions about where and how to use these. Pharmacies typically print the medication instructions only on the boxes and not directly on the medication tubes.   If your medication is too expensive, please contact our office at [phone] option 4 or send us  a message through MyChart.   We are unable to tell what your co-pay for medications will be in advance as this is different depending on your insurance coverage. However, we may be able to find a substitute medication at lower cost or fill out paperwork to get insurance to cover a needed medication.   If a prior authorization is required to get your medication covered by your insurance company, please allow us  1-2 business days to complete this process.  Drug prices often vary depending on where the prescription is filled and some pharmacies may offer cheaper prices.  The website www.goodrx.com contains coupons for medications through different  pharmacies. The prices here do not account for what the cost may be with help from insurance (it may be cheaper with your insurance), but the website can give you the price if you did not use any insurance.  - You can print the associated coupon and take it with your prescription to the pharmacy.  - You may also stop by our office during regular business hours and pick up a GoodRx coupon card.  - If you need your prescription sent electronically to a different pharmacy, notify our office through [facility] or by phone at [phone] option 4.     Si Usted Necesita Algo Despus de Su Visita  Tambin puede enviarnos un mensaje a travs de Clinical Cytogeneticist. Por lo general respondemos a los mensajes de MyChart en el transcurso de 1 a 2 das hbiles.  Para renovar recetas, por favor pida a su farmacia que se ponga en contacto con nuestra oficina. Randi lakes de fax es [place] [phone].  Si tiene un asunto urgente cuando la clnica est cerrada y que no puede esperar hasta el siguiente da hbil, puede llamar/localizar a su doctor(a) al nmero que aparece a continuacin.   Por favor, tenga en cuenta que aunque hacemos todo lo posible para estar disponibles para asuntos urgentes fuera del horario de [place], no estamos disponibles las 24 horas del da, los 7 [address]  [address] de la [place].   Si tiene un problema urgente y no puede comunicarse con nosotros, puede optar por buscar atencin mdica  en el consultorio de su doctor(a), en una clnica privada, en un centro de atencin urgente o en una sala de emergencias.  Si tiene engineer, drilling, por favor llame inmediatamente al 911 o vaya a la sala de emergencias.  Nmeros de bper  - Dr. Hester: [phone]  - Dra. Jackquline: [phone]  - Dr. Claudene: [phone]  - Dra. Kitts: [phone]  En caso de inclemencias del [place], por favor llame a nuestra lnea principal al [phone] para ignacia actualizacin  sobre el estado de cualquier retraso o  cierre.  Consejos para la medicacin en dermatologa: Por favor, guarde las cajas en las que vienen los medicamentos de uso tpico para ayudarle a seguir las instrucciones sobre dnde y cmo usarlos. Las farmacias generalmente imprimen las instrucciones del medicamento slo en las cajas y no directamente en los tubos del [place].   Si su medicamento es muy caro, por favor, pngase en contacto con landry rieger llamando al [phone] y presione la opcin 4 o envenos un mensaje a travs de Clinical Cytogeneticist.   No podemos decirle cul ser su copago por los medicamentos por adelantado ya que esto es diferente dependiendo de la cobertura de su seguro. Sin embargo, es posible que podamos encontrar un medicamento sustituto a audiological scientist un formulario para que el seguro cubra el medicamento que se considera necesario.   Si se requiere una autorizacin previa para que su compaa de seguros cubra su medicamento, por favor permtanos de 1 a 2 das hbiles para completar este proceso.  Los precios de los medicamentos varan con frecuencia dependiendo del environmental consultant de dnde se surte la receta y alguna farmacias pueden ofrecer precios ms baratos.  El sitio web www.goodrx.com tiene cupones para medicamentos de health and safety inspector. Los precios aqu no tienen en cuenta lo que podra costar con la ayuda del seguro (puede ser ms barato con su seguro), pero el sitio web puede darle el precio si no utiliz tourist information centre manager.  - Puede imprimir el cupn correspondiente y llevarlo con su receta a la farmacia.  - Tambin puede pasar por nuestra oficina durante el horario de atencin regular y education officer, museum una tarjeta de cupones de GoodRx.  - Si necesita que su receta se enve electrnicamente a una farmacia diferente, informe a nuestra oficina a travs de MyChart de [place] o por telfono llamando al [phone] y presione la opcin 4.

## 2024-12-23 NOTE — Progress Notes (Signed)
 "   Subjective   Destiny Leon is a 59 y.o. female who presents for the following: Total body skin exam for skin cancer screening and mole check. The patient has spots, moles and lesions to be evaluated, some may be new or changing and the patient may have concern these could be cancer.. Patient is new patient  Today patient reports: Patient has not had a skin screening before. She has 3 places starting at nose chest and left arm. PCP recommended she see a dermatologist for those areas and patient states no irritation. No personal or family hx of skin cancer.  Review of Systems:    No other skin or systemic complaints except as noted in HPI or Assessment and Plan.  The following portions of the chart were reviewed this encounter and updated as appropriate: medications, allergies, medical history  Relevant Medical History:  n/a   Objective  (SKPE) Well appearing patient in no apparent distress; mood and affect are within normal limits. Examination was performed of the: Full Skin Examination: scalp, head, eyes, ears, nose, lips, neck, chest, axillae, abdomen, back, buttocks, bilateral upper extremities, bilateral lower extremities, hands, feet, fingers, toes, fingernails, and toenails.   Examination notable for: SKIN EXAM, Angioma(s): Scattered red vascular papule(s)  , Lentigo/lentigines: Scattered pigmented macules that are tan to brown in color and are somewhat non-uniform in shape and concentrated in the sun-exposed areas, Nevus/nevi: Scattered well-demarcated, regular, pigmented macule(s) and/or papule(s)  , Seborrheic Keratosis(es): Stuck-on appearing keratotic papule(s) on the trunk, none  irritated with redness, crusting, edema, and/or partial avulsion, Actinic Damage/Elastosis: chronic sun damage: dyspigmentation, telangiectasia, and wrinkling  Examination limited by: Undergarments     Assessment & Plan  (SKAP)   SKIN CANCER SCREENING PERFORMED TODAY.  BENIGN SKIN FINDINGS  -  Lentigines  - Seborrheic keratoses  - Hemangiomas   - Nevus/Multiple Benign Nevi - Reassurance provided regarding the benign appearance of lesions noted on exam today; no treatment is indicated in the absence of symptoms/changes. - Reinforced importance of photoprotective strategies including liberal and frequent sunscreen use of a broad-spectrum SPF 30 or greater, use of protective clothing, and sun avoidance for prevention of cutaneous malignancy and photoaging.  Counseled patient on the importance of regular self-skin monitoring as well as routine clinical skin examinations as scheduled.   ACTINIC DAMAGE - Chronic condition, secondary to cumulative UV/sun exposure - Recommend daily broad spectrum sunscreen SPF 30+ to sun-exposed areas, reapply every 2 hours as needed.  - Staying in the shade or wearing long sleeves, sun glasses (UVA+UVB protection) and wide brim hats (4-inch brim around the entire circumference of the hat) are also recommended for sun protection.  - Call for new or changing lesions.  FACIAL ELASTOSIS Exam: Rhytides and volume loss.  Treatment Plan: - Start tretinoin  0.025% cream 3 times weekly at night to affected area after cleaning. Educated patient about proper use and potential side effects, including dryness, irritation, sun sensitivity, and transient worsening of acne.   Recommend daily broad spectrum sunscreen SPF 30+ to sun-exposed areas, reapply every 2 hours as needed. Call for new or changing lesions.  Staying in the shade or wearing long sleeves, sun glasses (UVA+UVB protection) and wide brim hats (4-inch brim around the entire circumference of the hat) are also recommended for sun protection.    Was sun protection counseling provided?: Yes   Level of service outlined above   Patient instructions (SKPI)   Procedures, orders, diagnosis for this visit:  INFLAMED SEBORRHEIC KERATOSIS Chest  x1 - Destruction of lesion - Chest x1 Complexity: simple    Destruction method: cryotherapy   Informed consent: discussed and consent obtained   Timeout:  patient name, date of birth, surgical site, and procedure verified Lesion destroyed using liquid nitrogen: Yes   Region frozen until ice ball extended beyond lesion: Yes   Cryo cycles: 1 or 2. Outcome: patient tolerated procedure well with no complications   Post-procedure details: wound care instructions given   Additional details:  Cryotherapy Aftercare  Wash gently with soap and water everyday.   Apply Vaseline and Band-Aid daily until healed.    LENTIGO   SEBORRHEIC KERATOSIS   MULTIPLE BENIGN NEVI   CHERRY ANGIOMA   ACTINIC SKIN DAMAGE   ELASTOSIS OF SKIN    Inflamed seborrheic keratosis -     Destruction of lesion  Lentigo  Seborrheic keratosis  Multiple benign nevi  Cherry angioma  Actinic skin damage  Elastosis of skin  Other orders -     Tretinoin ; Apply topically at bedtime. Apply 3 times weekly at night to effected area after cleaning. Increase frequency up to nightly as tolerated.  Dispense: 20 g; Refill: 5    Return to clinic: Return in about 1 year (around 12/23/2025) for TBSE, w/ Dr. Raymund.  I, Almetta Nora, RMA, am acting as scribe for Lauraine JAYSON Raymund, MD .   Documentation: I have reviewed the above documentation for accuracy and completeness, and I agree with the above.  Lauraine JAYSON Raymund, MD  "

## 2025-01-03 ENCOUNTER — Other Ambulatory Visit (INDEPENDENT_AMBULATORY_CARE_PROVIDER_SITE_OTHER)

## 2025-01-03 ENCOUNTER — Ambulatory Visit: Payer: Self-pay | Admitting: Primary Care

## 2025-01-03 DIAGNOSIS — E039 Hypothyroidism, unspecified: Secondary | ICD-10-CM | POA: Diagnosis not present

## 2025-01-03 LAB — TSH: TSH: 1.3 u[IU]/mL (ref 0.35–5.50)

## 2025-01-05 ENCOUNTER — Other Ambulatory Visit: Payer: Self-pay | Admitting: Primary Care

## 2025-01-05 DIAGNOSIS — E039 Hypothyroidism, unspecified: Secondary | ICD-10-CM

## 2025-01-05 MED ORDER — LEVOTHYROXINE SODIUM 88 MCG PO TABS: ORAL_TABLET | ORAL | 2 refills | Status: AC

## 2025-11-03 ENCOUNTER — Encounter: Admitting: Primary Care

## 2025-12-26 ENCOUNTER — Ambulatory Visit
